# Patient Record
Sex: Female | Born: 1985 | Race: White | Hispanic: No | Smoking: Former smoker
Health system: Southern US, Community
[De-identification: ages and names within clinical notes are randomized; demographics above are authoritative.]

## PROBLEM LIST (undated history)

## (undated) DIAGNOSIS — F32A Depression, unspecified: Secondary | ICD-10-CM

## (undated) DIAGNOSIS — F419 Anxiety disorder, unspecified: Secondary | ICD-10-CM

## (undated) DIAGNOSIS — K219 Gastro-esophageal reflux disease without esophagitis: Secondary | ICD-10-CM

## (undated) DIAGNOSIS — Z789 Other specified health status: Secondary | ICD-10-CM

## (undated) HISTORY — DX: Anxiety disorder, unspecified: F41.9

## (undated) HISTORY — PX: NO PAST SURGERIES: SHX2092

## (undated) HISTORY — DX: Depression, unspecified: F32.A

## (undated) HISTORY — DX: Gastro-esophageal reflux disease without esophagitis: K21.9

## (undated) HISTORY — DX: Other specified health status: Z78.9

---
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
## 2007-07-22 ENCOUNTER — Emergency Department: Payer: Self-pay | Admitting: Emergency Medicine

## 2009-07-28 ENCOUNTER — Emergency Department: Payer: Self-pay | Admitting: Emergency Medicine

## 2009-07-30 ENCOUNTER — Observation Stay (HOSPITAL_COMMUNITY): Admission: EM | Admit: 2009-07-30 | Discharge: 2009-07-30 | Payer: Self-pay | Admitting: Emergency Medicine

## 2010-07-10 IMAGING — CT CT STONE STUDY
1 of 2 series · 15 of 32 positions shown, 19 images · non-contrast
Comparison: none

REASON FOR EXAM: Right flank pain with hematuria
COMMENTS:   LMP: Two weeks ago

[Series 2: stone · axial · 0.62mm/px · z∈[-894,-495]mm · 15 of 145 slices shown, 19 images]
[im 6/145  soft-tissue]
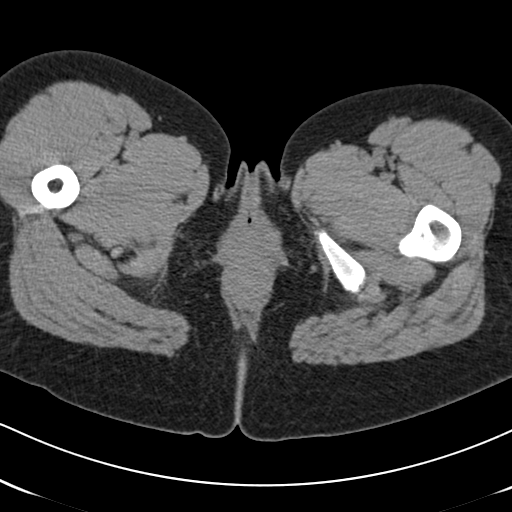
[im 6/145  bone]
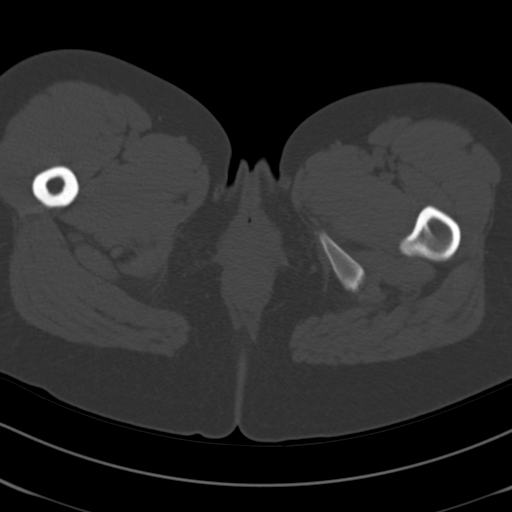
[im 17/145  soft-tissue]
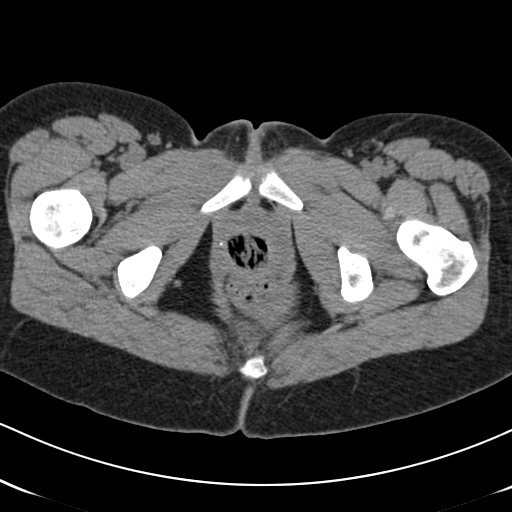
[im 28/145  soft-tissue]
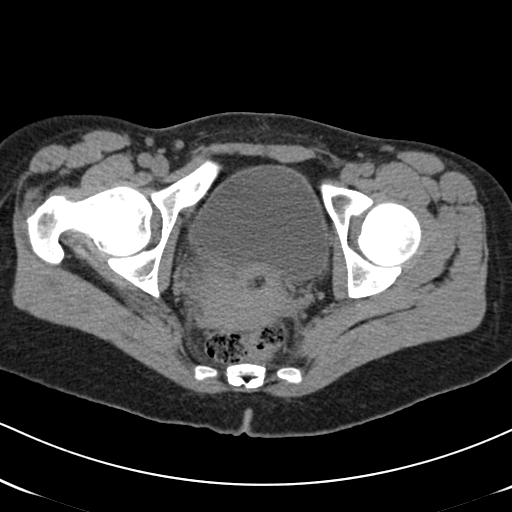
[im 39/145  soft-tissue]
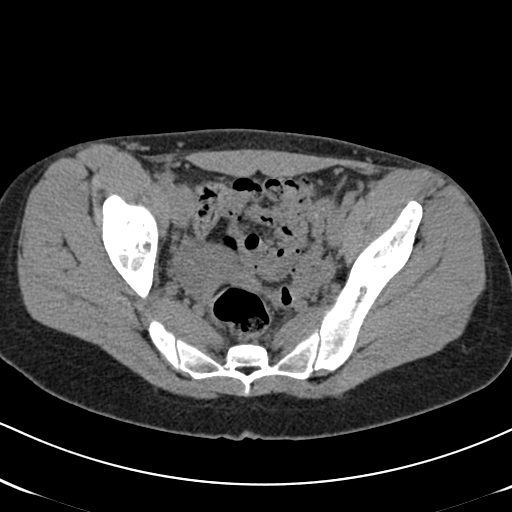
[im 50/145  soft-tissue]
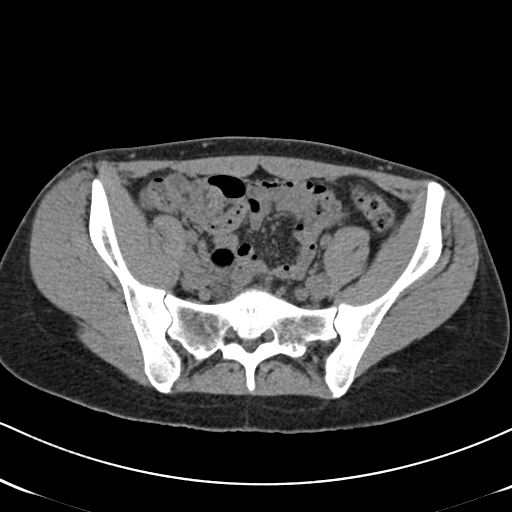
[im 61/145  soft-tissue]
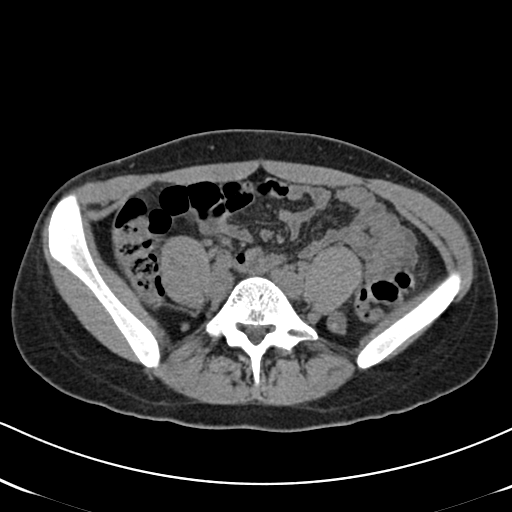
[im 73/145  soft-tissue]
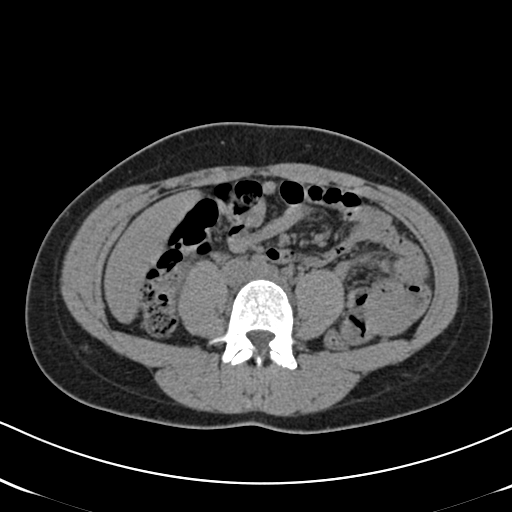
[im 84/145  soft-tissue]
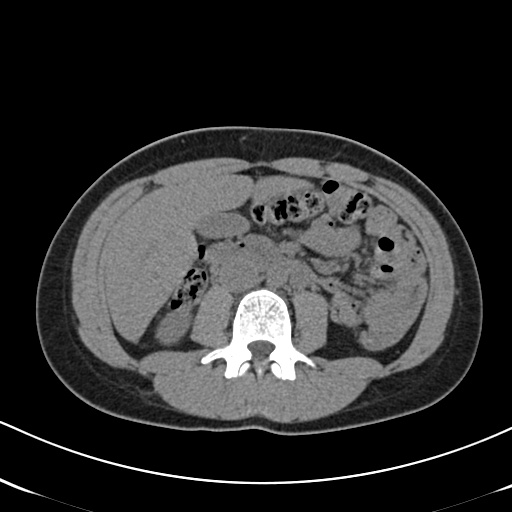
[im 95/145  soft-tissue]
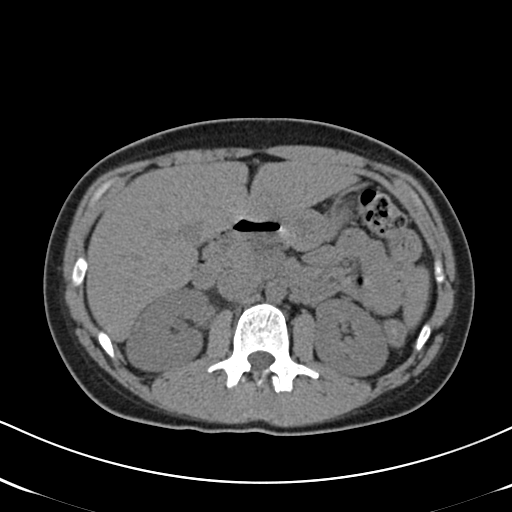
[im 95/145  bone]
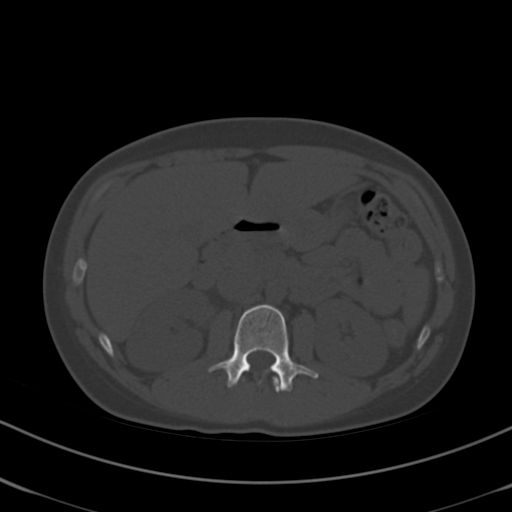
[im 106/145  soft-tissue]
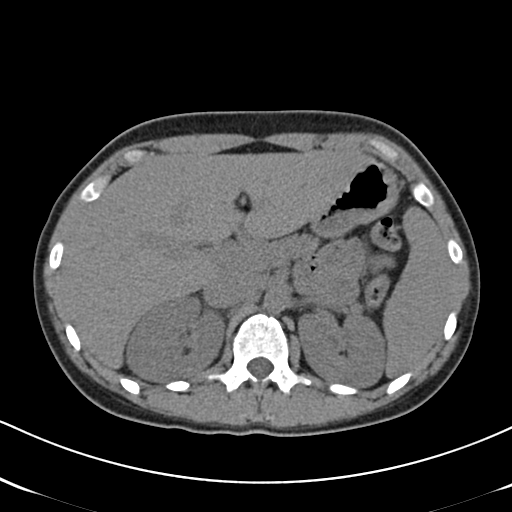
[im 117/145  soft-tissue]
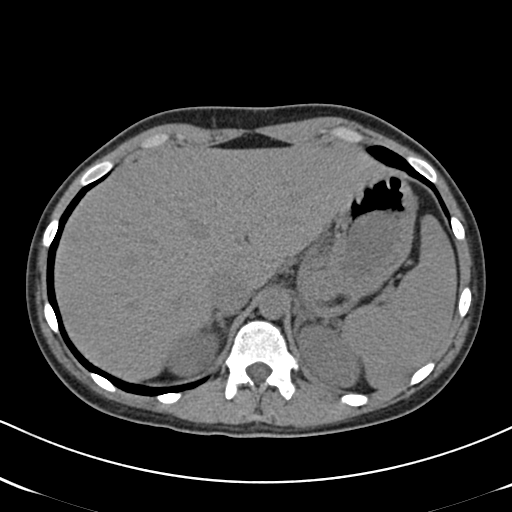
[im 122/145  lung]
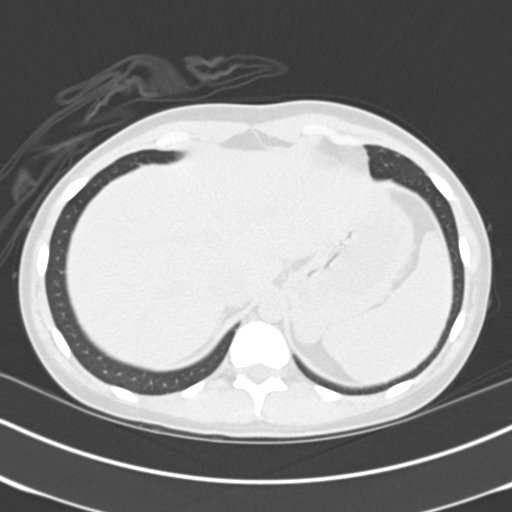
[im 128/145  soft-tissue]
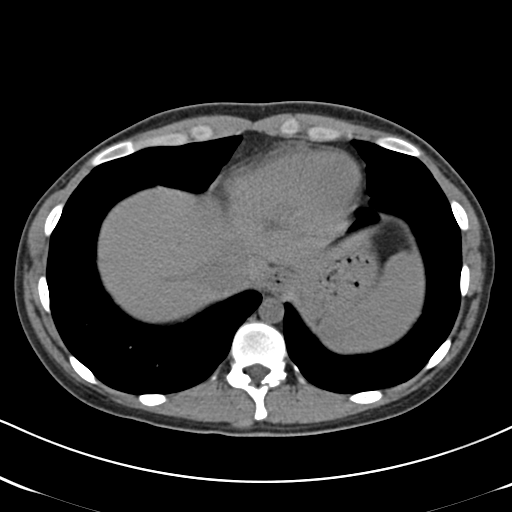
[im 128/145  lung]
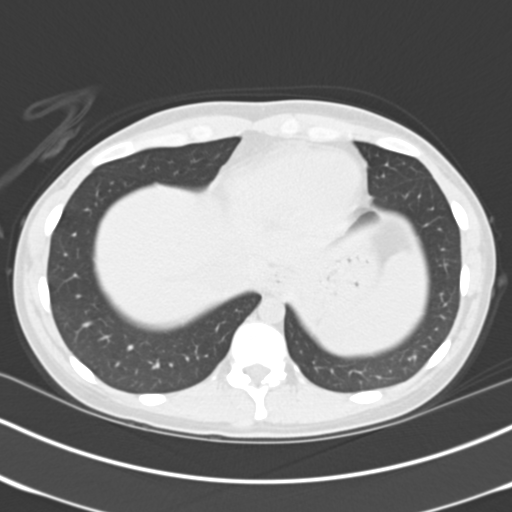
[im 133/145  lung]
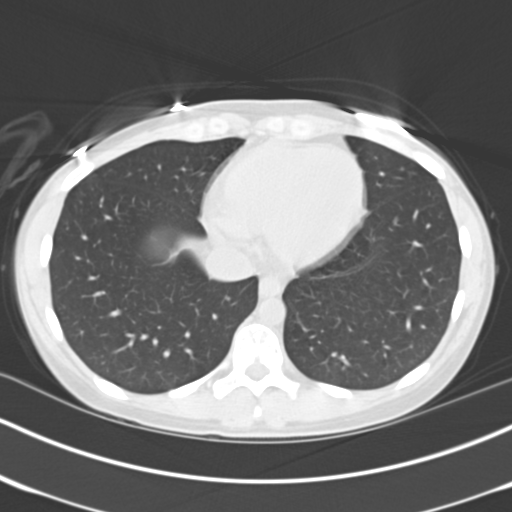
[im 139/145  soft-tissue]
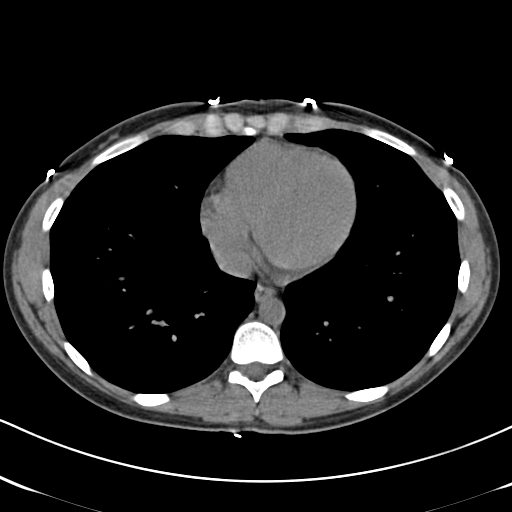
[im 139/145  lung]
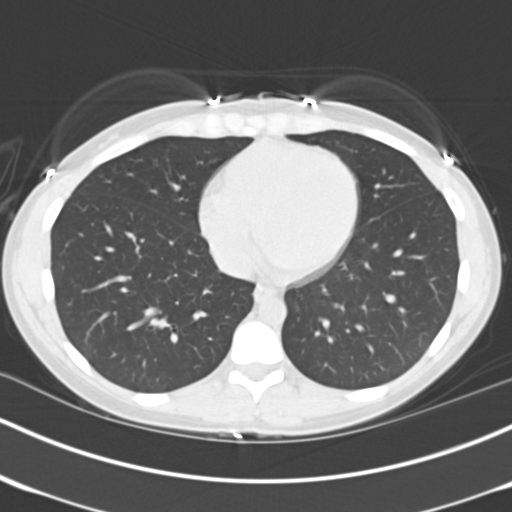

[15 of 32 positions shown; findings below may reference images not displayed]

PROCEDURE:     CT  - CT ABDOMEN /PELVIS WO (STONE)  - July 28, 2009 [DATE]

RESULT:     Axial noncontrast CT scanning was performed through the abdomen
and pelvis at 3 mm intervals and slice thicknesses. Review of 3-dimensional
reconstructed images was performed separately on the right server monitor.

The kidneys are normal in contour. I do not see evidence of calcified kidney
stones. Hydronephrosis and hydroureter are not demonstrated. There is a
calcification in the right adnexal region that may reflect a phlebolith. It
does not appear to be within the ureter. The partially distended urinary
bladder is normal in appearance.

The liver, spleen, partially distended gallbladder, and nondistended
stomach, pancreas, adrenal glands, and periaortic and pericaval regions
exhibit no acute abnormality. The unopacified loops of small and large bowel
are normal in appearance. I see no free fluid in the abdomen or pelvis.

The uterus is situated to the right of midline. I do not see evidence of
adnexal masses. The lumbar vertebral bodies are preserved in height. The
lung bases are clear.
IMPRESSION: 1. I do not see objective evidence of hydronephrosis nor hydroureter. No
definite calcified stones are demonstrated. If the patient's symptoms
strongly suggest urinary tract pathology, a followup contrast enhanced CT
scan with immediate and delayed images would be useful.
2. I do not see objective evidence of acute abnormality of the uterus or
ovaries.
3. I see no acute hepatobiliary abnormality nor acute bowel abnormality on
this noncontrast study. Again, followup contrast-enhanced scanning may be
useful if the patient's symptoms remain unexplained.

## 2010-07-12 IMAGING — US US RENAL
1 series · 14 of 25 positions shown · non-contrast
Comparison: None.

CLINICAL DATA: History of fever, nausea, vomiting.  Recent urinary
tract infection.

RENAL/URINARY TRACT ULTRASOUND COMPLETE

[Series 1: us renal · 0.28mm/px · 14 of 37 slices shown]
[im 1/37]
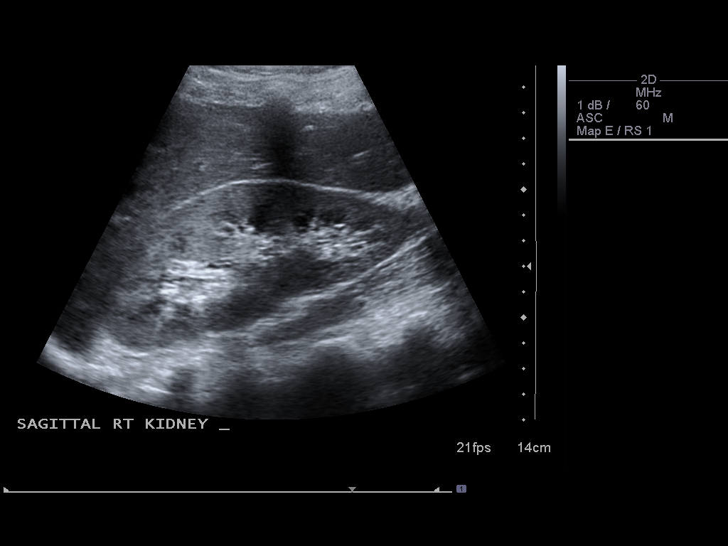
[im 4/37]
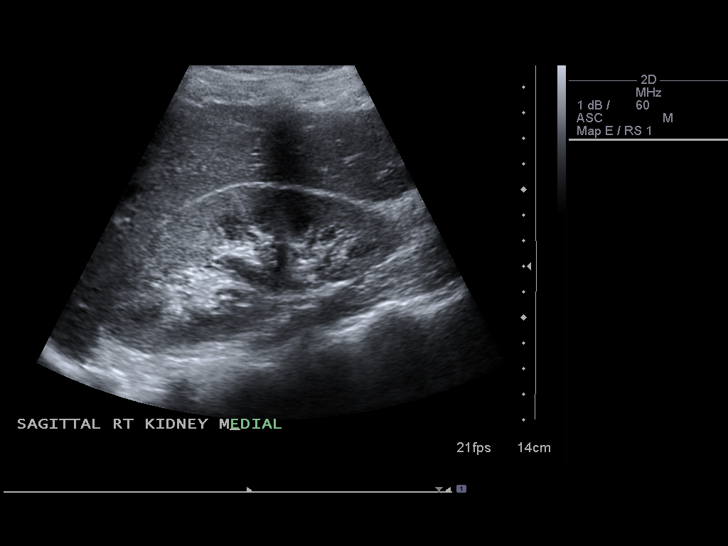
[im 7/37]
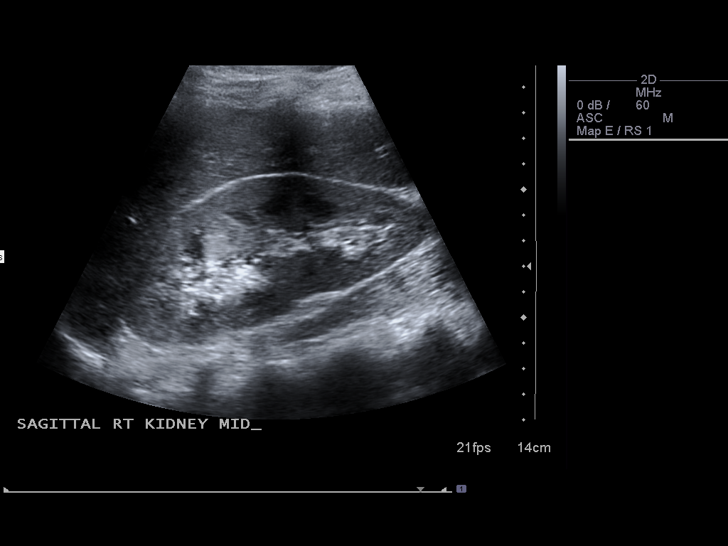
[im 10/37]
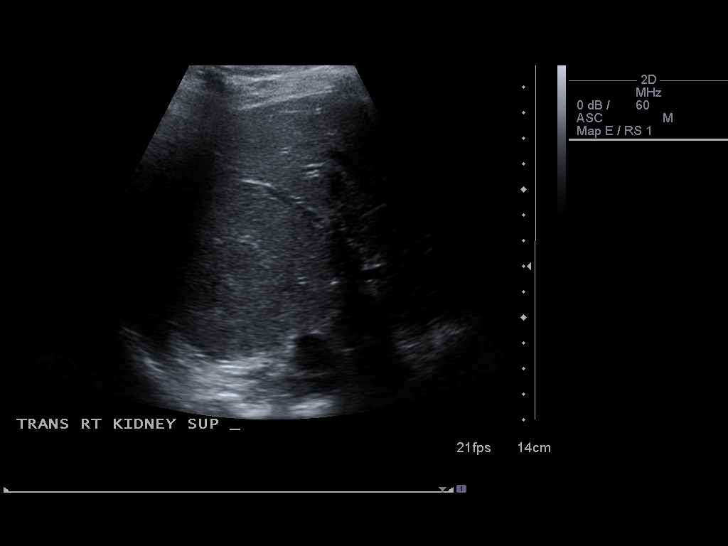
[im 13/37]
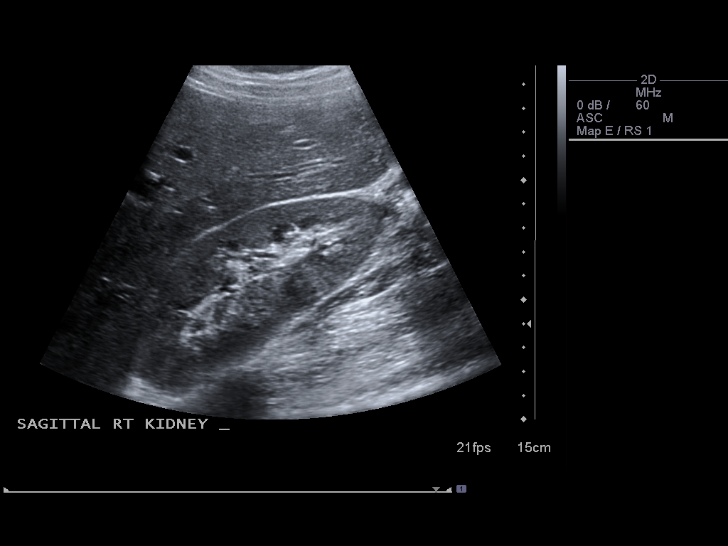
[im 14/37]
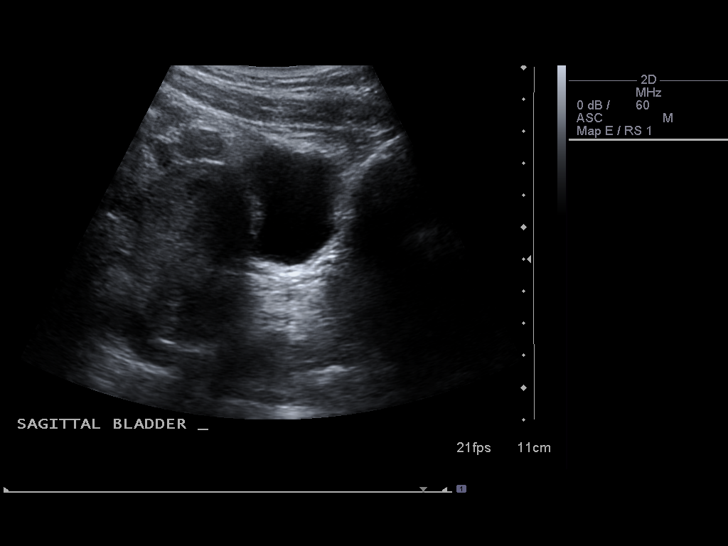
[im 17/37]
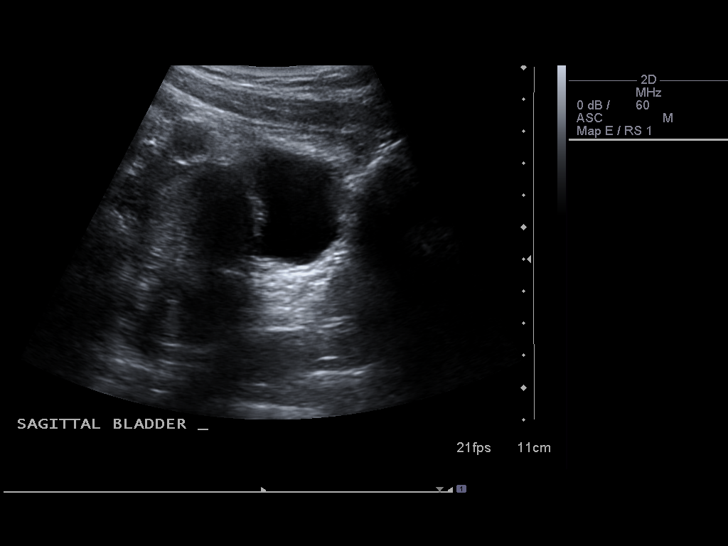
[im 20/37]
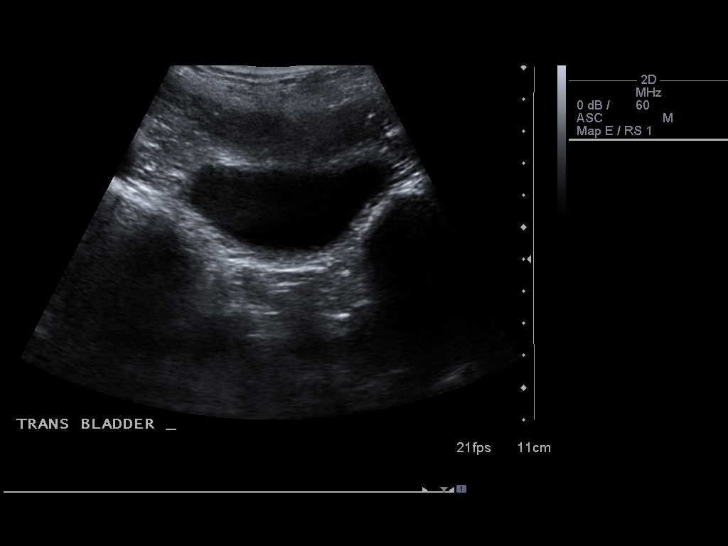
[im 23/37]
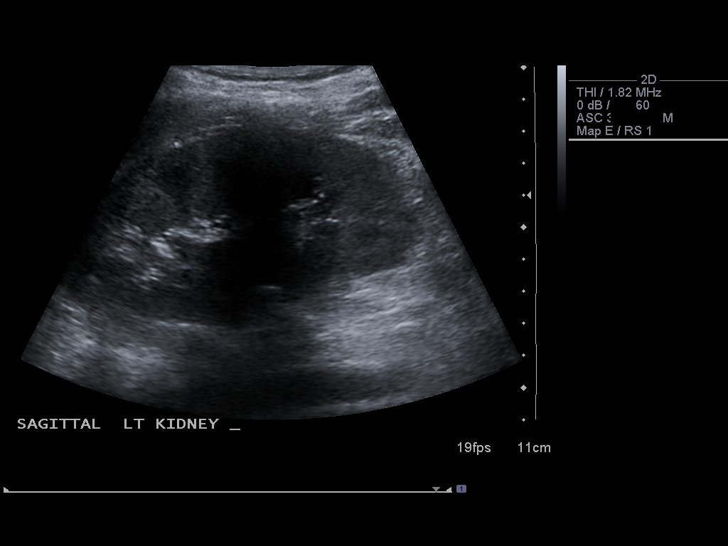
[im 25/37]
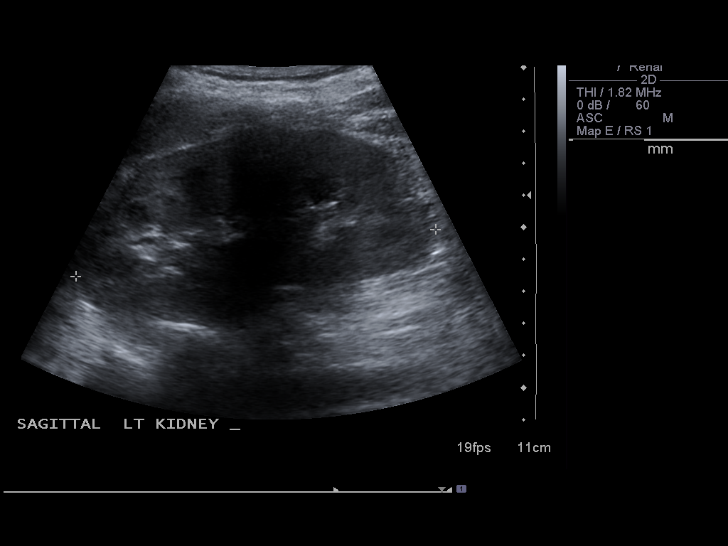
[im 28/37]
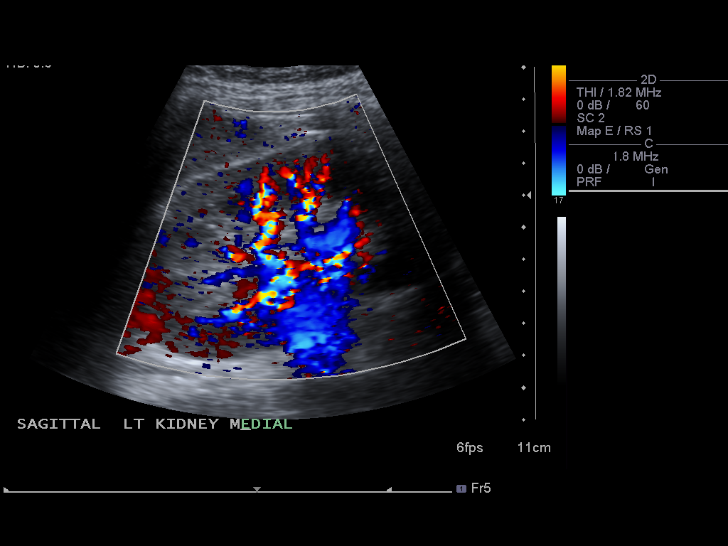
[im 31/37]
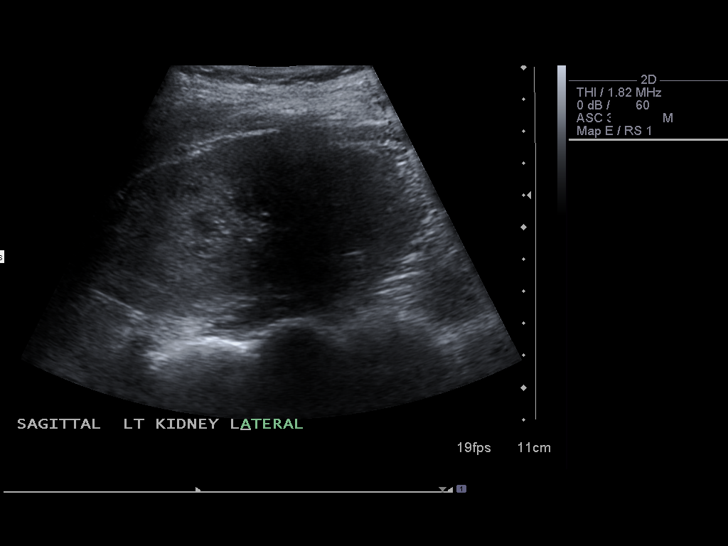
[im 34/37]
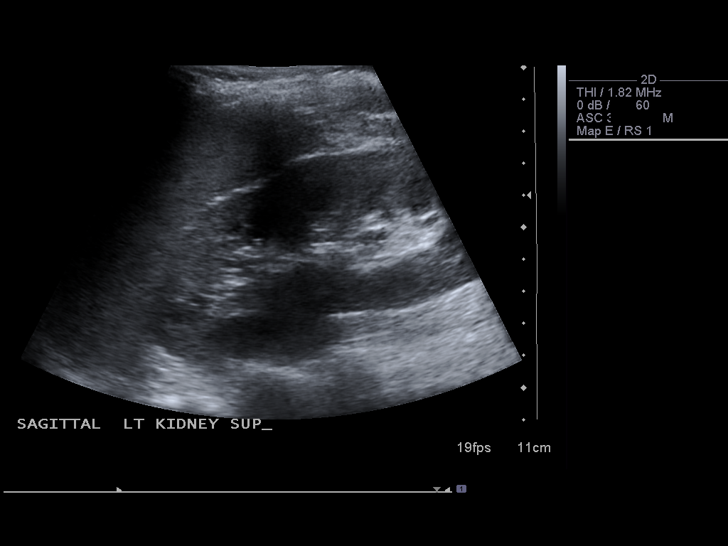
[im 37/37]
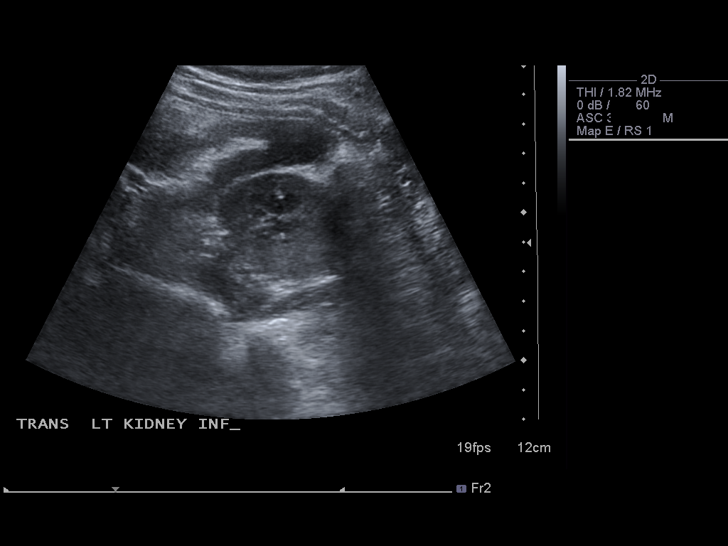

[14 of 25 positions shown; findings below may reference images not displayed]

FINDINGS: Right Kidney:  Right renal length is 13.1 cm.

Left Kidney:  Left renal length is 12.3 cm.

There are parenchyma of both kidneys is slightly hypoechoic and
with color Doppler ultrasound there appears to be hyperemia of the
parenchyma both kidneys.  This could be associated with infection.
No abscess is seen.  No hydronephrosis, solid mass, cyst, calculus,
or parenchymal loss is evident.

Bladder:  Urinary bladder was not completely distended. No bladder
lesion is evident.
IMPRESSION: Bilaterally and diffusely the renal parenchyma is slightly
hypoechoic.  With color Doppler examination the parenchyma of both
kidneys appear to be hyperemic.  This could reflect inflammation or
infection.  However no abscess or carbuncle is seen.  No
hydronephrosis is evident.  No focal parenchymal loss is evident.

## 2010-09-29 LAB — URINALYSIS, ROUTINE W REFLEX MICROSCOPIC
Ketones, ur: 15 mg/dL — AB
Nitrite: NEGATIVE
Protein, ur: >300 mg/dL — AB
Specific Gravity, Urine: 1.027 (ref 1.005–1.030)
Urobilinogen, UA: 1 mg/dL (ref 0.0–1.0)

## 2010-09-29 LAB — DIFFERENTIAL
Eosinophils Absolute: 0 10*3/uL (ref 0.0–0.7)
Eosinophils Relative: 0 % (ref 0–5)
Lymphocytes Relative: 5 % — ABNORMAL LOW (ref 12–46)
Lymphs Abs: 1 10*3/uL (ref 0.7–4.0)
Monocytes Relative: 12 % (ref 3–12)
Neutrophils Relative %: 82 % — ABNORMAL HIGH (ref 43–77)

## 2010-09-29 LAB — CULTURE, BLOOD (ROUTINE X 2): Culture: NO GROWTH

## 2010-09-29 LAB — URINE CULTURE
Colony Count: NO GROWTH
Culture: NO GROWTH

## 2010-09-29 LAB — COMPREHENSIVE METABOLIC PANEL
ALT: 16 U/L (ref 0–35)
AST: 33 U/L (ref 0–37)
Calcium: 8.5 mg/dL (ref 8.4–10.5)
Creatinine, Ser: 1.01 mg/dL (ref 0.4–1.2)
GFR calc Af Amer: >60 mL/min (ref >60)
Sodium: 132 mEq/L — ABNORMAL LOW (ref 135–145)
Total Protein: 6.9 g/dL (ref 6.0–8.3)

## 2010-09-29 LAB — URINE MICROSCOPIC-ADD ON

## 2010-09-29 LAB — CBC
MCHC: 33.5 g/dL (ref 30.0–36.0)
RDW: 12.6 % (ref 11.5–15.5)

## 2010-09-29 LAB — POCT PREGNANCY, URINE: Preg Test, Ur: NEGATIVE

## 2016-12-25 ENCOUNTER — Ambulatory Visit
Admission: EM | Admit: 2016-12-25 | Discharge: 2016-12-25 | Disposition: A | Discharge status: Home / Self Care | Payer: BC Managed Care – PPO | Attending: Emergency Medicine | Admitting: Emergency Medicine

## 2016-12-25 ENCOUNTER — Encounter: Payer: Self-pay | Admitting: *Deleted

## 2016-12-25 DIAGNOSIS — J029 Acute pharyngitis, unspecified: Secondary | ICD-10-CM

## 2016-12-25 LAB — RAPID STREP SCREEN (MED CTR MEBANE ONLY): STREPTOCOCCUS, GROUP A SCREEN (DIRECT): NEGATIVE

## 2016-12-25 MED ORDER — FLUTICASONE PROPIONATE 50 MCG/ACT NA SUSP: 2.0000 | Freq: Every day | NASAL | 0 refills | Status: DC

## 2016-12-25 MED ORDER — DEXAMETHASONE SODIUM PHOSPHATE 10 MG/ML IJ SOLN
10.0000 mg | Freq: Once | INTRAMUSCULAR | Status: AC
  Administered 2016-12-25: 10 mg via INTRAMUSCULAR

## 2016-12-25 MED ORDER — IBUPROFEN 600 MG PO TABS: 600.0000 mg | ORAL_TABLET | Freq: Four times a day (QID) | ORAL | 0 refills | Status: DC | PRN

## 2016-12-25 NOTE — Discharge Instructions (Signed)
Here is a list of primary care providers who are taking new patients:  Dr. Elizabeth Sauereanna Birch, Dr. Schuyler AmorWilliam Plonk [address] [place] [id number] [phone]  Dr. Everlene OtherJayce Cook [address]  [place] [id number]  [phone]  [facility] [place] [address] [place] [address]  [place] [address]  [phone]  [facility] [address]  [place], [place] [address] [phone]  [facility] [place] [address]  [phone] [place], [place] [address]  Go to www.goodrx.com to look up your medications. This will give you a list of where you can find your prescriptions at the most affordable prices. Or ask the pharmacist what the cash price is. This can be less expensive than what you would pay with insurance.    your rapid strep was negative today, so we have sent off a throat culture.  We will contact you and call in the appropriate antibiotics if your culture comes back positive for an infection requiring antibiotic treatment.  Give us a working phone number.  1 gram of Tylenol and 600 mg ibuprofen together 3-4 times a day as needed for pain.  Make sure you drink plenty of extra fluids.  Some people find salt water gargles and  Traditional Medicinal's "Throat Coat" tea helpful. Take 5 mL of liquid Benadryl and 5 mL of Maalox. Mix it together, and then hold it in your mouth for as long as you can and then swallow. You may do this 4 times a day.  Turn here or follow with primary care physician of your choice for mono testing if not better in 4 or 5 days.

## 2016-12-25 NOTE — ED Triage Notes (Signed)
Patient started having symptoms of sore throat, body aches, and fever 3 days ago.

## 2016-12-25 NOTE — ED Provider Notes (Signed)
HPI  SUBJECTIVE:  Patient reports sore throat starting 3 days ago. Sx worse with swallowing, talking.  Sx better with ibuprofen. Has been taking DayQuil, NyQuil w/ o relief.  + Fever tmax 101 + Swollen neck glands    No Cough/URI sxs. + Myalgias + Headache No Rash     No Recent Strep or mono Exposure the patient is a Associate Professor and also works at [employer] No Abdominal Pain No reflux sxs No Allergy sxs  No Breathing difficulty, muffled hot potato voice. She does report a raspy voice No Drooling No Trismus No abx in past month.  No ear pain, sinus pain or pressure + antipyretic in past 6-8 hours hrs Patient is not a smoker. She has a past medical history of GERD but states it is not bothering her. No history of mono, recurrent stroke, diabetes, hypertension LMP: 2 weeks ago. Denies possibility of being pregnant PMD: None   History reviewed. No pertinent past medical history.  History reviewed. No pertinent surgical history.  History reviewed. No pertinent family history.  Social History  Substance Use Topics  . Smoking status: Former Games developer  . Smokeless tobacco: Never Used  . Alcohol use Yes    No current facility-administered medications for this encounter.   Current Outpatient Prescriptions:  .  fluticasone (FLONASE) 50 MCG/ACT nasal spray, Place 2 sprays into both nostrils daily., Disp: 16 g, Rfl: 0 .  ibuprofen (ADVIL,MOTRIN) 600 MG tablet, Take 1 tablet (600 mg total) by mouth every 6 (six) hours as needed., Disp: 30 tablet, Rfl: 0  Allergies  Allergen Reactions  . Sunflower Oil Anxiety     ROS  As noted in HPI.   Physical Exam  BP (!) 122/91 (BP Location: Left Arm)   Pulse 98   Temp 98.3 F (36.8 C) (Oral)   Resp 16   Ht 5\' 2"  (1.575 m)   Wt 150 lb (68 kg)   LMP 12/11/2016   SpO2 100%   BMI 27.44 kg/m   Constitutional: Well developed, well nourished, no acute distress Eyes:  EOMI, conjunctiva normal bilaterally HENT: Normocephalic,  atraumatic,mucus membranes moist. +  nasal congestion . No sinus tenderness + erythematous oropharynx - enlarged tonsils +  exudates. Uvula midline. + PND  Respiratory: Normal inspiratory effort Cardiovascular: Normal rate, no murmurs, rubs, gallops GI: nondistended, nontender. No appreciable splenomegaly skin: No rash, skin intact Lymph: + tender cervical LN L>R Musculoskeletal: no deformities Neurologic: Alert & oriented x 3, no focal neuro deficits Psychiatric: Speech and behavior appropriate.  ED Course   Medications  dexamethasone (DECADRON) injection 10 mg (10 mg Intramuscular Given 12/25/16 1119)    Orders Placed This Encounter  Procedures  . Rapid strep screen    Standing Status:   Standing    Number of Occurrences:   1  . Culture, group A strep    Standing Status:   Standing    Number of Occurrences:   1    Results for orders placed or performed during the hospital encounter of 12/25/16 (from the past 24 hour(s))  Rapid strep screen     Status: None   Collection Time: 12/25/16 10:01 AM  Result Value Ref Range   Streptococcus, Group A Screen (Direct) NEGATIVE NEGATIVE   No results found.  ED Clinical Impression  Pharyngitis, unspecified etiology   ED Assessment/Plan   Rapid strep negative, however pt with centor score 4/4. Discussed option of treating empirically for strep today, however, patient states that she frequently gets yeast  infections and would like to wait for the results of the throat culture prior to initiating antibiotic treatment. Also offered to test for mono today, but I feel that it is too early in the course of illness and that a negative mono test would not necessarily rule out mono. She will return here in 3-4 days for mono testing if she still has symptoms.. Obtaining throat culture to guide antibiotic treatment. Discussed this with patient We'll contact them if culture is positive, and will call in Appropriate antibiotics. Patient home with  ibuprofen, Tylenol, Benadryl/Maalox mixture. Giving 10 mg of Decadron IM here for symptomatic relief. Also Flonase. We'll provide a primary care referral  Discussed labs, imaging, MDM, plan and followup with patient . Discussed sn/sx that should prompt return to the  ED. Patient agrees with plan.  Meds ordered this encounter  Medications  . dexamethasone (DECADRON) injection 10 mg  . ibuprofen (ADVIL,MOTRIN) 600 MG tablet    Sig: Take 1 tablet (600 mg total) by mouth every 6 (six) hours as needed.    Dispense:  30 tablet    Refill:  0  . fluticasone (FLONASE) 50 MCG/ACT nasal spray    Sig: Place 2 sprays into both nostrils daily.    Dispense:  16 g    Refill:  0     *This clinic note was created using Scientist, clinical (histocompatibility and immunogenetics)Dragon dictation software. Therefore, there may be occasional mistakes despite careful proofreading.    Domenick GongMortenson, Trentin Knappenberger, MD 12/25/16 1149

## 2016-12-27 LAB — CULTURE, GROUP A STREP (THRC)

## 2018-12-28 ENCOUNTER — Encounter: Payer: Self-pay | Admitting: Obstetrics and Gynecology

## 2018-12-28 ENCOUNTER — Ambulatory Visit: Payer: BC Managed Care – PPO | Admitting: Obstetrics and Gynecology

## 2018-12-28 ENCOUNTER — Other Ambulatory Visit (HOSPITAL_COMMUNITY)
Admission: RE | Admit: 2018-12-28 | Discharge: 2018-12-28 | Disposition: A | Discharge status: Home / Self Care | Payer: BC Managed Care – PPO | Source: Ambulatory Visit | Attending: Obstetrics and Gynecology | Admitting: Obstetrics and Gynecology

## 2018-12-28 ENCOUNTER — Other Ambulatory Visit: Payer: Self-pay

## 2018-12-28 VITALS — BP 111/76 | HR 106 | Ht 62.0 in | Wt 144.0 lb

## 2018-12-28 DIAGNOSIS — Z30011 Encounter for initial prescription of contraceptive pills: Secondary | ICD-10-CM

## 2018-12-28 DIAGNOSIS — Z01419 Encounter for gynecological examination (general) (routine) without abnormal findings: Secondary | ICD-10-CM

## 2018-12-28 DIAGNOSIS — Z113 Encounter for screening for infections with a predominantly sexual mode of transmission: Secondary | ICD-10-CM | POA: Diagnosis present

## 2018-12-28 DIAGNOSIS — Z124 Encounter for screening for malignant neoplasm of cervix: Secondary | ICD-10-CM | POA: Diagnosis present

## 2018-12-28 DIAGNOSIS — F32A Depression, unspecified: Secondary | ICD-10-CM | POA: Insufficient documentation

## 2018-12-28 DIAGNOSIS — F321 Major depressive disorder, single episode, moderate: Secondary | ICD-10-CM

## 2018-12-28 DIAGNOSIS — Z1339 Encounter for screening examination for other mental health and behavioral disorders: Secondary | ICD-10-CM

## 2018-12-28 DIAGNOSIS — F329 Major depressive disorder, single episode, unspecified: Secondary | ICD-10-CM | POA: Insufficient documentation

## 2018-12-28 DIAGNOSIS — Z1331 Encounter for screening for depression: Secondary | ICD-10-CM

## 2018-12-28 MED ORDER — CITALOPRAM HYDROBROMIDE 10 MG PO TABS: 10.0000 mg | ORAL_TABLET | Freq: Every day | ORAL | 0 refills | Status: DC

## 2018-12-28 MED ORDER — LO LOESTRIN FE 1 MG-10 MCG / 10 MCG PO TABS: 1.0000 | ORAL_TABLET | Freq: Every day | ORAL | 4 refills | Status: DC
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
## 2018-12-29 LAB — CYTOLOGY - PAP

## 2018-12-30 LAB — CYTOLOGY - PAP
Chlamydia: NEGATIVE
Diagnosis: NEGATIVE
HPV: NOT DETECTED
Neisseria Gonorrhea: NEGATIVE
Trichomonas: NEGATIVE

## 2018-12-30 LAB — RPR+HSVIGM+HBSAG+HSV2(IGG)+...
HIV Screen 4th Generation wRfx: NONREACTIVE
HSV 2 IgG, Type Spec: <0.91 index (ref 0.00–0.90)
HSVI/II Comb IgM: 1.57 Ratio — ABNORMAL HIGH (ref 0.00–0.90)
Hepatitis B Surface Ag: NEGATIVE
RPR Ser Ql: NONREACTIVE

## 2019-01-03 ENCOUNTER — Other Ambulatory Visit: Payer: Self-pay | Admitting: Advanced Practice Midwife

## 2019-01-03 DIAGNOSIS — Z30011 Encounter for initial prescription of contraceptive pills: Secondary | ICD-10-CM

## 2019-01-03 MED ORDER — NORETHIN ACE-ETH ESTRAD-FE 1-20 MG-MCG PO TABS: 1.0000 | ORAL_TABLET | Freq: Every day | ORAL | 4 refills | Status: DC

## 2019-01-03 NOTE — Progress Notes (Signed)
Sent Rx for Junel due to patient insurance doesn't cover Lo Loestrin Fe.

## 2019-01-06 ENCOUNTER — Other Ambulatory Visit: Payer: Self-pay | Admitting: Obstetrics and Gynecology

## 2019-01-06 DIAGNOSIS — Z113 Encounter for screening for infections with a predominantly sexual mode of transmission: Secondary | ICD-10-CM

## 2019-01-25 ENCOUNTER — Ambulatory Visit: Payer: BC Managed Care – PPO | Admitting: Obstetrics and Gynecology

## 2019-01-25 ENCOUNTER — Other Ambulatory Visit: Payer: Self-pay

## 2019-01-25 ENCOUNTER — Other Ambulatory Visit: Payer: Self-pay | Admitting: Obstetrics and Gynecology

## 2019-01-25 ENCOUNTER — Encounter: Payer: Self-pay | Admitting: Obstetrics and Gynecology

## 2019-01-25 VITALS — BP 117/79 | HR 80 | Wt 155.0 lb

## 2019-01-25 DIAGNOSIS — Z113 Encounter for screening for infections with a predominantly sexual mode of transmission: Secondary | ICD-10-CM

## 2019-01-25 DIAGNOSIS — F321 Major depressive disorder, single episode, moderate: Secondary | ICD-10-CM

## 2019-01-25 DIAGNOSIS — F419 Anxiety disorder, unspecified: Secondary | ICD-10-CM

## 2019-01-25 NOTE — Telephone Encounter (Signed)
Please advise 

## 2019-01-25 NOTE — Progress Notes (Signed)
Subjective:     Yolanda Evans is a 33 y.o. female who presents for follow up of depression. Current symptoms include anhedonia, depressed mood, difficulty concentrating, fatigue and insomnia. Symptoms have been gradually improving since that time. Patient denies feelings of worthlessness/guilt, psychomotor agitation, psychomotor retardation, suicidal thoughts with specific plan, suicidal thoughts without plan and poor appetite or overeating. Previous treatment includes: medication (Celexa 10 mg). She complains of the following side effects from the treatment: feeling tired.  She tried the medication for about 3 weeks, but it just made her feel very tired. She was taking it in the morning for two weeks, then at night, but she still felt tired during the day.   Since she stopped taking the medication her irritability has come back. She would rate her irritability when it was at its best, it would have been rated as a 1 (now a 3 again).    Anxiety: Patient complains of anxiety disorder.  She has the following symptoms: irritable, feeling nervous, anxious, or on edge and trouble relaxing. Onset of symptoms was approximately a few weeks ago, unchanged since that time. She denies current suicidal and homicidal ideation. Family history significant for no psychiatric illness.Possible organic causes contributing are: work environment. Risk factors: negative life event recent break up Previous treatment includes Celexa.  She complains of the following side effects from the treatment: feeling tired during the day  The following portions of the patient's history were reviewed and updated as appropriate: allergies, current medications, past family history, past medical history, past social history, past surgical history and problem list.  Review of Systems Pertinent items are noted in HPI.    Objective:    BP 117/79   Pulse 80   Wt 155 lb (70.3 kg)   LMP 01/24/2019   BMI 28.35 kg/m   General:  alert,  cooperative and no distress  Affect & Behavior:  full facial expressions, good grooming, good insight, normal perception, normal reasoning, normal speech pattern and content and normal thought patterns      Depression screen [facility] 2/[facility] 01/25/2019 12/28/2018  Decreased Interest 3 2  Down, Depressed, Hopeless 1 2  PHQ - 2 Score 4 4  Altered sleeping 3 3  Tired, decreased energy 3 3  Change in appetite 0 0  Feeling bad or failure about yourself  0 0  Trouble concentrating 3 3  Moving slowly or fidgety/restless 0 0  Suicidal thoughts 0 0  PHQ-9 Score 13 13  Difficult doing work/chores Somewhat difficult Somewhat difficult   GAD 7 : Generalized Anxiety Score 01/25/2019  Nervous, Anxious, on Edge 3  Control/stop worrying 0  Worry too much - different things 0  Trouble relaxing 3  Restless 1  Easily annoyed or irritable 3  Afraid - awful might happen 0  Total GAD 7 Score 10  Anxiety Difficulty Somewhat difficult    Assessment:    Depression, unchanged  Anxiety - new    Plan:    Medications: Wellbutrin. Continue with counseling. Instructed patient to contact office or on-call physician promptly should condition worsen or any new symptoms appear and provided on-call telephone numbers. IF THE PATIENT HAS ANY SUICIDAL OR HOMICIDAL IDEATIONS, CALL THE OFFICE, DISCUSS WITH A SUPPORT MEMBER, OR GO TO THE ER IMMEDIATELY. Patient was agreeable with this plan. Follow up: 4 weeks. Spent 20 minutes (>50% of visit) discussing the risks of anxiety disorder and depression, the  pathophysiology, etiology, risks, and principles of treatment.    Thomasene MohairStephen Modesta Sammons, MD, Evern CoreFACOG  [place] 01/25/2019 4:40 PM

## 2019-01-27 LAB — HSV(HERPES SMPLX)ABS-I+II(IGG+IGM)-BLD
HSV 1 Glycoprotein G Ab, IgG: 11 index — ABNORMAL HIGH (ref 0.00–0.90)
HSV 2 IgG, Type Spec: <0.91 index (ref 0.00–0.90)
HSVI/II Comb IgM: 1.12 Ratio — ABNORMAL HIGH (ref 0.00–0.90)

## 2019-01-28 MED ORDER — BUPROPION HCL ER (XL) 150 MG PO TB24: 150.0000 mg | ORAL_TABLET | Freq: Every day | ORAL | 1 refills | Status: DC

## 2019-01-28 NOTE — Addendum Note (Signed)
Addended by: Prentice Docker D on: 01/28/2019 10:32 AM   Modules accepted: Orders

## 2019-03-26 ENCOUNTER — Other Ambulatory Visit: Payer: Self-pay | Admitting: Obstetrics and Gynecology

## 2019-03-26 DIAGNOSIS — F419 Anxiety disorder, unspecified: Secondary | ICD-10-CM

## 2019-03-26 DIAGNOSIS — F321 Major depressive disorder, single episode, moderate: Secondary | ICD-10-CM

## 2019-03-27 NOTE — Telephone Encounter (Signed)
Advise

## 2019-03-28 NOTE — Telephone Encounter (Signed)
If she schedules a follow up appointment, i'll refill.

## 2019-03-30 ENCOUNTER — Encounter: Payer: Self-pay | Admitting: Obstetrics and Gynecology

## 2019-03-30 ENCOUNTER — Other Ambulatory Visit: Payer: Self-pay | Admitting: Obstetrics and Gynecology

## 2019-03-30 DIAGNOSIS — F321 Major depressive disorder, single episode, moderate: Secondary | ICD-10-CM

## 2019-03-30 DIAGNOSIS — F419 Anxiety disorder, unspecified: Secondary | ICD-10-CM

## 2019-03-30 MED ORDER — BUPROPION HCL ER (XL) 150 MG PO TB24: 150.0000 mg | ORAL_TABLET | Freq: Every day | ORAL | 1 refills | Status: DC

## 2019-03-30 NOTE — Telephone Encounter (Signed)
Patient is schedule 04/05/19 with SDJ

## 2019-04-05 ENCOUNTER — Other Ambulatory Visit: Payer: Self-pay

## 2019-04-05 ENCOUNTER — Encounter: Payer: Self-pay | Admitting: Obstetrics and Gynecology

## 2019-04-05 ENCOUNTER — Ambulatory Visit: Payer: BC Managed Care – PPO | Admitting: Obstetrics and Gynecology

## 2019-04-05 VITALS — BP 109/74 | HR 84 | Wt 153.0 lb

## 2019-04-05 DIAGNOSIS — F419 Anxiety disorder, unspecified: Secondary | ICD-10-CM | POA: Diagnosis not present

## 2019-04-05 DIAGNOSIS — F321 Major depressive disorder, single episode, moderate: Secondary | ICD-10-CM | POA: Diagnosis not present

## 2019-04-05 MED ORDER — BUPROPION HCL ER (XL) 150 MG PO TB24: 150.0000 mg | ORAL_TABLET | Freq: Every day | ORAL | 3 refills | Status: DC

## 2019-04-05 NOTE — Progress Notes (Signed)
Obstetrics & Gynecology Office Visit   Chief Complaint  Patient presents with  . Follow-up    Medication anxiety/depression    History of Present Illness: 33 y.o. G52P0040 female who presents in follow up for anxiety and depression. She was started on Wellbutrin 2 months ago.  She was on Celexa and stopped that.  Since her last visit her symptoms have improved.  She is still having trouble sleeping at night.  She does have increased energy.  She states that she is not sleeping due to not being very tired.  She was taking Wellbutrin in the morning, but found it easier to sleep if she took the medication at night.  She has no side effects to the medication.  She states that she has the energy to get through the day.  She denies SI/HI.  Depression screen [facility] 2/9 04/05/2019 01/25/2019 12/28/2018  Decreased Interest 1 3 2   Down, Depressed, Hopeless 0 1 2  PHQ - 2 Score 1 4 4   Altered sleeping - 3 3  Tired, decreased energy - 3 3  Change in appetite - 0 0  Feeling bad or failure about yourself  - 0 0  Trouble concentrating - 3 3  Moving slowly or fidgety/restless - 0 0  Suicidal thoughts - 0 0  PHQ-9 Score - 13 13  Difficult doing work/chores - Somewhat difficult Somewhat difficult   GAD 7 : Generalized Anxiety Score 04/05/2019 01/25/2019  Nervous, Anxious, on Edge 1 3  Control/stop worrying 0 0  Worry too much - different things 0 0  Trouble relaxing 0 3  Restless 0 1  Easily annoyed or irritable 1 3  Afraid - awful might happen 0 0  Total GAD 7 Score 2 10  Anxiety Difficulty Not difficult at all Somewhat difficult    Past Medical History:  Diagnosis Date  . No known health problems     Past Surgical History:  Procedure Laterality Date  . NO PAST SURGERIES      Gynecologic History: Patient's last menstrual period was 03/29/2019.  Obstetric History: G4P0040  Family History  Problem Relation Age of Onset  . Breast cancer Paternal Grandmother        no contact    Social  History   Socioeconomic History  . Marital status: Single    Spouse name: Not on file  . Number of children: Not on file  . Years of education: Not on file  . Highest education level: Not on file  Occupational History  . Not on file  Social Needs  . Financial resource strain: Not on file  . Food insecurity    Worry: Not on file    Inability: Not on file  . Transportation needs    Medical: Not on file    Non-medical: Not on file  Tobacco Use  . Smoking status: Current Every Day Smoker    Packs/day: 0.25    Types: Cigarettes  . Smokeless tobacco: Never Used  Substance and Sexual Activity  . Alcohol use: Yes  . Drug use: No  . Sexual activity: Yes    Birth control/protection: Pill  Lifestyle  . Physical activity    Days per week: Not on file    Minutes per session: Not on file  . Stress: Not on file  Relationships  . Social 01/27/2019 on phone: Not on file    Gets together: Not on file    Attends religious service: Not on file  Active member of club or organization: Not on file    Attends meetings of clubs or organizations: Not on file    Relationship status: Not on file  . Intimate partner violence    Fear of current or ex partner: Not on file    Emotionally abused: Not on file    Physically abused: Not on file    Forced sexual activity: Not on file  Other Topics Concern  . Not on file  Social History Narrative  . Not on file    Allergies  Allergen Reactions  . Sunflower Oil Anxiety    Prior to Admission medications   Medication Sig Start Date End Date Taking? Authorizing Provider  buPROPion (WELLBUTRIN XL) 150 MG 24 hr tablet Take 1 tablet (150 mg total) by mouth daily. 03/30/19   Will Bonnet, MD  norethindrone-ethinyl estradiol (LOESTRIN FE) 1-20 MG-MCG tablet Take 1 tablet by mouth daily. 01/03/19 01/03/20  Rod Can, CNM    Review of Systems  Constitutional: Negative.   HENT: Negative.   Eyes: Negative.   Respiratory: Negative.    Cardiovascular: Negative.   Gastrointestinal: Negative.   Genitourinary: Negative.   Musculoskeletal: Negative.   Skin: Negative.   Neurological: Negative.   Psychiatric/Behavioral: Negative for depression, hallucinations, memory loss, substance abuse and suicidal ideas. The patient has insomnia. The patient is not nervous/anxious.      Physical Exam BP 109/74   Pulse 84   Wt 153 lb (69.4 kg)   LMP 03/29/2019   BMI 27.98 kg/m  Patient's last menstrual period was 03/29/2019. Physical Exam Constitutional:      General: She is not in acute distress.    Appearance: Normal appearance. She is well-developed.  HENT:     Head: Normocephalic and atraumatic.  Eyes:     General: No scleral icterus.    Conjunctiva/sclera: Conjunctivae normal.  Neck:     Musculoskeletal: Normal range of motion and neck supple.  Cardiovascular:     Rate and Rhythm: Normal rate and regular rhythm.     Heart sounds: No murmur. No friction rub. No gallop.   Pulmonary:     Effort: Pulmonary effort is normal. No respiratory distress.     Breath sounds: Normal breath sounds. No wheezing or rales.  Abdominal:     General: Bowel sounds are normal. There is no distension.     Palpations: Abdomen is soft. There is no mass.     Tenderness: There is no abdominal tenderness. There is no guarding or rebound.  Musculoskeletal: Normal range of motion.  Neurological:     General: No focal deficit present.     Mental Status: She is alert and oriented to person, place, and time.     Cranial Nerves: No cranial nerve deficit.  Skin:    General: Skin is warm and dry.     Findings: No erythema.  Psychiatric:        Mood and Affect: Mood normal.        Behavior: Behavior normal.        Judgment: Judgment normal.    Female chaperone present for pelvic and breast  portions of the physical exam  Assessment: 33 y.o. G86P0040 female here for  1. Current moderate episode of major depressive disorder, unspecified whether  recurrent ([place])   2. Anxiety      Plan: Problem List Items Addressed This Visit      Other   Depression - Primary   Relevant Medications   buPROPion (WELLBUTRIN XL)  150 MG 24 hr tablet    Other Visit Diagnoses    Anxiety       Relevant Medications   buPROPion (WELLBUTRIN XL) 150 MG 24 hr tablet    Patient responding well to treatment.  Will continue with current dosing. Discussed use of trazodone for trouble sleeping. She will consider and declines at present due to having a good energy level.   Reviewed precautions for going to the ER, like SI/HI.   Follow up in 4 months, unless she needs to be seen sooner. Will assess for ability to discontinue medication at that time.  15 minutes spent in face to face discussion with > 50% spent in counseling,management, and coordination of care of her anxiety and depression.   Thomasene Mohair, MD 04/05/2019 5:22 PM

## 2019-10-04 ENCOUNTER — Other Ambulatory Visit: Payer: Self-pay | Admitting: Obstetrics and Gynecology

## 2019-10-04 DIAGNOSIS — F321 Major depressive disorder, single episode, moderate: Secondary | ICD-10-CM

## 2019-10-04 DIAGNOSIS — F419 Anxiety disorder, unspecified: Secondary | ICD-10-CM

## 2019-10-04 NOTE — Telephone Encounter (Signed)
Advise, pt's last appointment was 03/2019

## 2019-11-15 ENCOUNTER — Other Ambulatory Visit: Payer: Self-pay | Admitting: Advanced Practice Midwife

## 2019-11-15 DIAGNOSIS — Z30011 Encounter for initial prescription of contraceptive pills: Secondary | ICD-10-CM

## 2019-11-15 NOTE — Telephone Encounter (Signed)
Please advise 

## 2019-11-16 NOTE — Telephone Encounter (Signed)
Advise

## 2019-11-16 NOTE — Telephone Encounter (Signed)
This is SDJ patient- please route to him. Thanks

## 2019-11-21 ENCOUNTER — Other Ambulatory Visit: Payer: Self-pay

## 2019-11-21 DIAGNOSIS — Z30011 Encounter for initial prescription of contraceptive pills: Secondary | ICD-10-CM

## 2019-11-21 MED ORDER — NORETHIN ACE-ETH ESTRAD-FE 1-20 MG-MCG PO TABS: 1.0000 | ORAL_TABLET | Freq: Every day | ORAL | 0 refills | Status: DC

## 2020-01-03 ENCOUNTER — Ambulatory Visit (INDEPENDENT_AMBULATORY_CARE_PROVIDER_SITE_OTHER): Payer: BC Managed Care – PPO | Admitting: Obstetrics and Gynecology

## 2020-01-03 ENCOUNTER — Other Ambulatory Visit: Payer: Self-pay

## 2020-01-03 ENCOUNTER — Encounter: Payer: Self-pay | Admitting: Obstetrics and Gynecology

## 2020-01-03 VITALS — BP 127/79 | HR 81 | Ht 62.0 in | Wt 176.0 lb

## 2020-01-03 DIAGNOSIS — Z01419 Encounter for gynecological examination (general) (routine) without abnormal findings: Secondary | ICD-10-CM | POA: Diagnosis not present

## 2020-01-03 DIAGNOSIS — Z1339 Encounter for screening examination for other mental health and behavioral disorders: Secondary | ICD-10-CM

## 2020-01-03 DIAGNOSIS — Z113 Encounter for screening for infections with a predominantly sexual mode of transmission: Secondary | ICD-10-CM

## 2020-01-03 DIAGNOSIS — Z3041 Encounter for surveillance of contraceptive pills: Secondary | ICD-10-CM

## 2020-01-03 DIAGNOSIS — Z1331 Encounter for screening for depression: Secondary | ICD-10-CM | POA: Diagnosis not present

## 2020-01-03 MED ORDER — LO LOESTRIN FE 1 MG-10 MCG / 10 MCG PO TABS: 1.0000 | ORAL_TABLET | Freq: Every day | ORAL | 4 refills | Status: DC

## 2020-01-03 NOTE — Progress Notes (Signed)
Gynecology Annual Exam   Chief Complaint  Patient presents with  . Gynecologic Exam    Vaginal discharge,no odor, no itching    History of Present Illness:  Ms. Yolanda Evans is a 34 y.o. [id number] who LMP was Patient's last menstrual period was 12/30/2019., presents today for her annual examination.  Her menses are regular every 28-30 days, lasting 4 day(s).  Dysmenorrhea severe, occurring throughout menses. She does not have intermenstrual bleeding.  She is sexually active. She uses combined OCPs for contraception.  Last Pap: 12/28/2018  Results were: no abnormalities /neg HPV DNA negative Hx of STDs: HSV  There is a FH of breast cancer in her paternal aunt. There is no FH of ovarian cancer. The patient does not do self-breast exams.  Tobacco use: former. She quit last September.  Alcohol use: social drinker Exercise: every day.  The patient wears seatbelts: yes.   The patient reports that domestic violence in her life is absent.   She had some symptoms of a yeast infection prior to her period.  It went away after her period. She had some abnormal discharge, but not odor or discharge.    Past Medical History:  Diagnosis Date  . No known health problems     Past Surgical History:  Procedure Laterality Date  . NO PAST SURGERIES      Prior to Admission medications   Medication Sig Start Date End Date Taking? Authorizing Provider  norethindrone-ethinyl estradiol (LOESTRIN FE) 1-20 MG-MCG tablet Take 1 tablet by mouth daily. 11/21/19 11/20/20  Will Bonnet, MD    Allergies  Allergen Reactions  . Sunflower Oil Anxiety   Obstetric History: G4P0040  Social History   Socioeconomic History  . Marital status: Single    Spouse name: Not on file  . Number of children: Not on file  . Years of education: Not on file  . Highest education level: Not on file  Occupational History  . Not on file  Tobacco Use  . Smoking status: Former Smoker    Packs/day: 0.25    Types:  Cigarettes  . Smokeless tobacco: Never Used  Vaping Use  . Vaping Use: Never used  Substance and Sexual Activity  . Alcohol use: Yes  . Drug use: No  . Sexual activity: Yes    Birth control/protection: Pill  Other Topics Concern  . Not on file  Social History Narrative  . Not on file   Social Determinants of Health   Financial Resource Strain:   . Difficulty of Paying Living Expenses:   Food Insecurity:   . Worried About Charity fundraiser in the Last Year:   . Arboriculturist in the Last Year:   Transportation Needs:   . Film/video editor (Medical):   Marland Kitchen Lack of Transportation (Non-Medical):   Physical Activity:   . Days of Exercise per Week:   . Minutes of Exercise per Session:   Stress:   . Feeling of Stress :   Social Connections:   . Frequency of Communication with Friends and Family:   . Frequency of Social Gatherings with Friends and Family:   . Attends Religious Services:   . Active Member of Clubs or Organizations:   . Attends Archivist Meetings:   Marland Kitchen Marital Status:   Intimate Partner Violence:   . Fear of Current or Ex-Partner:   . Emotionally Abused:   Marland Kitchen Physically Abused:   . Sexually Abused:     Family History  Problem Relation Age of Onset  . Breast cancer Paternal Grandmother        no contact    Review of Systems  Constitutional: Negative.   HENT: Negative.   Eyes: Negative.   Respiratory: Negative.   Cardiovascular: Negative.   Gastrointestinal: Negative.   Genitourinary: Negative.   Musculoskeletal: Negative.   Skin: Negative.   Neurological: Negative.   Psychiatric/Behavioral: Negative.      Physical Exam BP 127/79   Pulse 81   Ht 5\' 2"  (1.575 m)   Wt 176 lb (79.8 kg)   LMP 12/30/2019   BMI 32.19 kg/m    Physical Exam Constitutional:      General: She is not in acute distress.    Appearance: Normal appearance. She is well-developed.  Genitourinary:     Pelvic exam was performed with patient in the  lithotomy position.     Vulva, urethra, bladder and uterus normal.     No inguinal adenopathy present in the right or left side.    No signs of injury in the vagina.     No vaginal discharge, erythema, tenderness or bleeding.     No cervical motion tenderness, discharge, lesion or polyp.     Uterus is mobile.     Uterus is not enlarged or tender.     No uterine mass detected.    Uterus is anteverted.     No right or left adnexal mass present.     Right adnexa not tender or full.     Left adnexa not tender or full.  HENT:     Head: Normocephalic and atraumatic.  Eyes:     General: No scleral icterus.    Conjunctiva/sclera: Conjunctivae normal.  Neck:     Thyroid: No thyromegaly.  Cardiovascular:     Rate and Rhythm: Normal rate and regular rhythm.     Heart sounds: No murmur heard.  No friction rub. No gallop.   Pulmonary:     Effort: Pulmonary effort is normal. No respiratory distress.     Breath sounds: Normal breath sounds. No wheezing or rales.  Chest:     Breasts:        Right: No inverted nipple, mass, nipple discharge, skin change or tenderness.        Left: No inverted nipple, mass, nipple discharge, skin change or tenderness.  Abdominal:     General: Bowel sounds are normal. There is no distension.     Palpations: Abdomen is soft. There is no mass.     Tenderness: There is no abdominal tenderness. There is no guarding or rebound.  Musculoskeletal:        General: No swelling or tenderness. Normal range of motion.     Cervical back: Normal range of motion and neck supple.  Lymphadenopathy:     Cervical: No cervical adenopathy.     Lower Body: No right inguinal adenopathy. No left inguinal adenopathy.  Neurological:     General: No focal deficit present.     Mental Status: She is alert and oriented to person, place, and time.     Cranial Nerves: No cranial nerve deficit.  Skin:    General: Skin is warm and dry.     Findings: No erythema or rash.  Psychiatric:         Mood and Affect: Mood normal.        Behavior: Behavior normal.        Judgment: Judgment normal.   Female chaperone present for pelvic  and breast  portions of the physical exam  Results:  Wet Prep: PH: 4.5 Clue Cells: Negative Fungal elements: Negative Trichomonas: Negative   AUDIT Questionnaire (screen for alcoholism): 5 PHQ-9: 4   Assessment: 34 y.o. G58P0040 female here for routine annual gynecologic examination  Plan: Problem List Items Addressed This Visit    None    Visit Diagnoses    Women's annual routine gynecological examination    -  Primary   Relevant Medications   Norethindrone-Ethinyl Estradiol-Fe Biphas (LO LOESTRIN FE) 1 MG-10 MCG / 10 MCG tablet   Screening for depression       Screening for alcoholism       Screen for STD (sexually transmitted disease)       Encounter for surveillance of contraceptive pills       Relevant Medications   Norethindrone-Ethinyl Estradiol-Fe Biphas (LO LOESTRIN FE) 1 MG-10 MCG / 10 MCG tablet      Screening: -- Blood pressure screen normal -- Weight screening: overweight: continue to monitor -- Depression screening negative (PHQ-9) -- Nutrition: normal -- cholesterol screening: not due for screening -- osteoporosis screening: not due -- tobacco screening: not using -- alcohol screening: AUDIT questionnaire indicates low-risk usage. -- family history of breast cancer screening: done. not at high risk. -- no evidence of domestic violence or intimate partner violence. -- STD screening: gonorrhea/chlamydia NAAT not collected per patient request. -- pap smear not collected per ASCCP guidelines  Rx for lo loestrin.   Thomasene Mohair, MD 01/03/2020 4:40 PM

## 2020-01-22 ENCOUNTER — Other Ambulatory Visit: Payer: Self-pay

## 2020-01-22 ENCOUNTER — Ambulatory Visit
Admission: RE | Admit: 2020-01-22 | Discharge: 2020-01-22 | Disposition: A | Discharge status: Home / Self Care | Payer: BC Managed Care – PPO | Source: Ambulatory Visit | Attending: Family Medicine | Admitting: Family Medicine

## 2020-01-22 VITALS — BP 109/71 | HR 100 | Temp 98.8°F | Resp 17 | Ht 62.0 in | Wt 172.0 lb

## 2020-01-22 DIAGNOSIS — R52 Pain, unspecified: Secondary | ICD-10-CM | POA: Diagnosis not present

## 2020-01-22 DIAGNOSIS — R05 Cough: Secondary | ICD-10-CM | POA: Diagnosis not present

## 2020-01-22 DIAGNOSIS — R6883 Chills (without fever): Secondary | ICD-10-CM | POA: Diagnosis not present

## 2020-01-22 DIAGNOSIS — J029 Acute pharyngitis, unspecified: Secondary | ICD-10-CM | POA: Diagnosis present

## 2020-01-22 DIAGNOSIS — Z79899 Other long term (current) drug therapy: Secondary | ICD-10-CM | POA: Insufficient documentation

## 2020-01-22 DIAGNOSIS — Z20822 Contact with and (suspected) exposure to covid-19: Secondary | ICD-10-CM | POA: Diagnosis not present

## 2020-01-22 LAB — SARS CORONAVIRUS 2 (TAT 6-24 HRS): SARS Coronavirus 2: NEGATIVE

## 2020-01-22 LAB — GROUP A STREP BY PCR: Group A Strep by PCR: NOT DETECTED

## 2020-01-22 MED ORDER — FLUCONAZOLE 150 MG PO TABS
150.0000 mg | ORAL_TABLET | Freq: Every day | ORAL | 0 refills | Status: DC
Start: 2020-01-22 — End: 2020-04-19

## 2020-01-22 MED ORDER — AMOXICILLIN 875 MG PO TABS
875.0000 mg | ORAL_TABLET | Freq: Two times a day (BID) | ORAL | 0 refills | Status: DC
Start: 2020-01-22 — End: 2020-04-19

## 2020-01-22 MED ORDER — LIDOCAINE VISCOUS HCL 2 % MT SOLN
10.0000 mL | Freq: Four times a day (QID) | OROMUCOSAL | 0 refills | Status: DC | PRN
Start: 2020-01-22 — End: 2020-04-19

## 2020-01-22 NOTE — Discharge Instructions (Addendum)
Take medication as prescribed. Rest. Drink plenty of fluids. Ibuprofen as needed.   Follow up with your primary care physician this week as needed. Return to Urgent care for new or worsening concerns.

## 2020-01-22 NOTE — ED Provider Notes (Signed)
[facility] ____________________________________________  Time seen: Approximately 11:35 AM  I have reviewed the triage vital signs and the nursing notes.   HISTORY  Chief Complaint Sore Throat and Generalized Body Aches   HPI Yolanda Evans is a 34 y.o. female presenting for evaluation of sore throat present for the last 2 days with chills and generalized body aches.  Denies known fevers.  States the chills come and go.  Denies any accompanying cough, congestion, chest pain or shortness of breath, vomiting, diarrhea, change in taste or smell.  States sore throat is persisting and feels like swallowing razor blades.  Able to still eat and drink but painful.  Has taken over-the-counter ibuprofen and Tylenol as well as cough congestion medication without resolution.  Denies known sick contacts.  Does not share drinks after others.  Significant other not sick with similar.  Has had COVID-19 vaccines.  Reports otherwise doing well.  Denies other recent sickness.  Patient's last menstrual period was 12/30/2019.     Past Medical History:  Diagnosis Date  . No known health problems     Patient Active Problem List   Diagnosis Date Noted  . Depression 12/28/2018    Past Surgical History:  Procedure Laterality Date  . NO PAST SURGERIES       No current facility-administered medications for this encounter.  Current Outpatient Medications:  .  amoxicillin (AMOXIL) 875 MG tablet, Take 1 tablet (875 mg total) by mouth 2 (two) times daily., Disp: 20 tablet, Rfl: 0 .  fluconazole (DIFLUCAN) 150 MG tablet, Take 1 tablet (150 mg total) by mouth daily. Take one pill orally as needed., Disp: 1 tablet, Rfl: 0 .  lidocaine (XYLOCAINE) 2 % solution, Use as directed 10 mLs in the mouth or throat every 6 (six) hours as needed (sore throat. gargle and spit as needed for sore throat.)., Disp: 100 mL, Rfl: 0 .  Norethindrone-Ethinyl Estradiol-Fe Biphas (LO LOESTRIN FE) 1 MG-10 MCG / 10 MCG  tablet, Take 1 tablet by mouth daily., Disp: 84 tablet, Rfl: 4  Allergies Sunflower oil  Family History  Problem Relation Age of Onset  . Breast cancer Paternal Grandmother        no contact    Social History Social History   Tobacco Use  . Smoking status: Former Smoker    Packs/day: 0.25    Types: Cigarettes  . Smokeless tobacco: Never Used  Vaping Use  . Vaping Use: Never used  Substance Use Topics  . Alcohol use: Yes    Comment: social  . Drug use: No    Review of Systems Constitutional: Positive chills Eyes: No visual changes. ENT: Positive sore throat. Cardiovascular: Denies chest pain. Respiratory: Denies shortness of breath. Gastrointestinal: No abdominal pain.  No nausea, no vomiting.  No diarrhea.  Musculoskeletal: Negative for back pain. Skin: Negative for rash.   ____________________________________________   PHYSICAL EXAM:  VITAL SIGNS: ED Triage Vitals  Enc Vitals Group     BP 01/22/20 1044 109/71     Pulse Rate 01/22/20 1044 100     Resp 01/22/20 1044 17     Temp 01/22/20 1044 98.8 F (37.1 C)     Temp Source 01/22/20 1044 Oral     SpO2 01/22/20 1044 100 %     Weight 01/22/20 1042 171 lb 15.3 oz (78 kg)     Height 01/22/20 1042 5\' 2"  (1.575 m)     Head Circumference --      Peak Flow --  Pain Score 01/22/20 1042 8     Pain Loc --      Pain Edu? --      Excl. in GC? --     Constitutional: Alert and oriented. Well appearing and in no acute distress. Eyes: Conjunctivae are normal.  ENT      Head: Normocephalic and atraumatic.      Nose: No congestion      Mouth/Throat: Mucous membranes are [email or web address].pt to moderate pharyngeal erythema.  2+ bilateral tonsillar swelling with bilateral exudate.  No uvular shift or deviation. Neck: No stridor. Supple without meningismus.  Hematological/Lymphatic/Immunilogical: No cervical lymphadenopathy. Cardiovascular: Normal rate, regular rhythm. Grossly normal heart sounds.  Good peripheral  circulation. Respiratory: Normal respiratory effort without tachypnea nor retractions. Breath sounds are clear and equal bilaterally. No wheezes, rales, rhonchi. Musculoskeletal: Steady gait Neurologic:  Normal speech and language.  Skin:  Skin is warm, dry and intact. No rash noted. Psychiatric: Mood and affect are normal. Speech and behavior are normal. Patient exhibits appropriate insight and judgment   ___________________________________________   LABS (all labs ordered are listed, but only abnormal results are displayed)  Labs Reviewed  GROUP A STREP BY PCR  SARS CORONAVIRUS 2 (TAT 6-24 HRS)    PROCEDURES  INITIAL IMPRESSION / ASSESSMENT AND PLAN / ED COURSE  Pertinent labs & imaging results that were available during my care of the patient were reviewed by me and considered in my medical decision making (see chart for details).  Well-appearing patient.  No acute distress.  Strep PCR negative.  COVID-19 testing sent.  Patient active tonsillitis, concerning for infectious etiology.  Will treat with oral amoxicillin, PR viscous lidocaine.  Patient request Diflucan, as given.  Supportive care, gargles.  Over-the-counter ibuprofen.Discussed indication, risks and benefits of medications with patient.   Discussed follow up with Primary care physician this week. Discussed follow up and return parameters including no resolution or any worsening concerns. Patient verbalized understanding and agreed to plan.   ____________________________________________   FINAL CLINICAL IMPRESSION(S) / ED DIAGNOSES  Final diagnoses:  Pharyngitis, unspecified etiology     ED Discharge Orders         Ordered    amoxicillin (AMOXIL) 875 MG tablet  2 times daily     Discontinue  Reprint     01/22/20 1127    lidocaine (XYLOCAINE) 2 % solution  Every 6 hours PRN     Discontinue  Reprint     01/22/20 1127    fluconazole (DIFLUCAN) 150 MG tablet  Daily     Discontinue  Reprint     01/22/20 1127            Note: This dictation was prepared with Dragon dictation along with smaller phrase technology. Any transcriptional errors that result from this process are unintentional.         Renford Dills, NP 01/22/20 1240

## 2020-01-22 NOTE — ED Triage Notes (Signed)
Pt with throat pain and generalized body aches starting over the weekend. Endorses chills.

## 2020-02-11 ENCOUNTER — Other Ambulatory Visit: Payer: Self-pay | Admitting: Obstetrics and Gynecology

## 2020-02-11 DIAGNOSIS — Z01419 Encounter for gynecological examination (general) (routine) without abnormal findings: Secondary | ICD-10-CM

## 2020-02-11 DIAGNOSIS — Z3041 Encounter for surveillance of contraceptive pills: Secondary | ICD-10-CM

## 2020-02-12 NOTE — Telephone Encounter (Signed)
Pt had annual 12/2019

## 2020-04-19 ENCOUNTER — Ambulatory Visit (INDEPENDENT_AMBULATORY_CARE_PROVIDER_SITE_OTHER): Payer: BC Managed Care – PPO | Admitting: Obstetrics

## 2020-04-19 ENCOUNTER — Encounter: Payer: Self-pay | Admitting: Obstetrics

## 2020-04-19 ENCOUNTER — Other Ambulatory Visit: Payer: Self-pay

## 2020-04-19 VITALS — BP 110/70 | HR 72 | Ht 62.0 in | Wt 182.0 lb

## 2020-04-19 DIAGNOSIS — N912 Amenorrhea, unspecified: Secondary | ICD-10-CM

## 2020-04-19 DIAGNOSIS — Z113 Encounter for screening for infections with a predominantly sexual mode of transmission: Secondary | ICD-10-CM | POA: Diagnosis not present

## 2020-04-19 DIAGNOSIS — Z23 Encounter for immunization: Secondary | ICD-10-CM

## 2020-04-19 LAB — POCT URINE PREGNANCY: Preg Test, Ur: NEGATIVE

## 2020-04-19 NOTE — Progress Notes (Signed)
Gynecology STD Evaluation   hChief Complaint:  Chief Complaint  Yolanda Evans presents with  . Gynecologic Exam    ex tested positive for chlamydia; preg test    History of Present Illness: Yolanda Evans is a 34 y.o. G4P0040 presents for STD testing.  The Yolanda Evans has not noted intermenstrual spotting,  has not experienced postcoital bleeding, and does not report increased vaginal discharge.  There is not a history of prior sexually transmitted infection(s).  Her bayfriend called her today and told her that he has Chlamydia. She is visibly upset and tearful. They have been partnered for over a year, and she suspects he has been unfaithful to her.   The Yolanda Evans is sexually active and reports several lifetime sexual partners . She currently uses Oral contraceptives: Present: yes for contraception. She occasionally uses condoms or barrier methods to prevent the spread of sexually transmitted infections.  The Yolanda Evans has not been previously transfused or tattooed.    Review of Systems: 10 point review of systems negative unless otherwise noted in HPI  Past Medical History:  Past Medical History:  Diagnosis Date  . No known health problems     Past Surgical History:  Past Surgical History:  Procedure Laterality Date  . NO PAST SURGERIES      Gynecologic History: Yolanda Evans's last menstrual period was 02/25/2020 (approximate).  Obstetric History: G4P0040  Family History:  Family History  Problem Relation Age of Onset  . Breast cancer Paternal Grandmother        no contact    Social History:  Social History   Socioeconomic History  . Marital status: Single    Spouse name: Not on file  . Number of children: Not on file  . Years of education: Not on file  . Highest education level: Not on file  Occupational History  . Not on file  Tobacco Use  . Smoking status: Former Smoker    Packs/day: 0.25    Types: Cigarettes  . Smokeless tobacco: Never Used  Vaping Use  . Vaping Use: Never  used  Substance and Sexual Activity  . Alcohol use: Yes    Comment: social  . Drug use: No  . Sexual activity: Yes    Birth control/protection: Pill  Other Topics Concern  . Not on file  Social History Narrative  . Not on file   Social Determinants of Health   Financial Resource Strain:   . Difficulty of Paying Living Expenses: Not on file  Food Insecurity:   . Worried About Programme researcher, broadcasting/film/video in the Last Year: Not on file  . Ran Out of Food in the Last Year: Not on file  Transportation Needs:   . Lack of Transportation (Medical): Not on file  . Lack of Transportation (Non-Medical): Not on file  Physical Activity:   . Days of Exercise per Week: Not on file  . Minutes of Exercise per Session: Not on file  Stress:   . Feeling of Stress : Not on file  Social Connections:   . Frequency of Communication with Friends and Family: Not on file  . Frequency of Social Gatherings with Friends and Family: Not on file  . Attends Religious Services: Not on file  . Active Member of Clubs or Organizations: Not on file  . Attends Banker Meetings: Not on file  . Marital Status: Not on file  Intimate Partner Violence:   . Fear of Current or Ex-Partner: Not on file  . Emotionally Abused: Not on  file  . Physically Abused: Not on file  . Sexually Abused: Not on file    Allergies:  Allergies  Allergen Reactions  . Sunflower Oil Anxiety and Hives    Exposure to oil, pt gets hives. Ingestion of sunflower seeds .Marland Kitchen Pt has anaphylaxis    Medications: Prior to Admission medications   Medication Sig Start Date End Date Taking? Authorizing Provider  LO LOESTRIN FE 1 MG-10 MCG / 10 MCG tablet TAKE 1 TABLET BY MOUTH DAILY 02/13/20 05/07/20 Yes Conard Novak, MD    Physical Exam Blood pressure 110/70, pulse 72, height 5\' 2"  (1.575 m), weight 182 lb (82.6 kg), last menstrual period 02/25/2020. Yolanda Evans's last menstrual period was 02/25/2020 (approximate).  General: NAD HEENT:  normocephalic, anicteric Genitourinary:  External: Normal external female genitalia.  Normal urethral meatus, normal  Bartholin's and Skene's glands.    Vagina: Normal vaginal mucosa, no evidence of prolapse.    Cervix: Grossly normal in appearance, no bleeding  Uterus: Non-enlarged, mobile, normal contour.  No CMT  Adnexa: ovaries non-enlarged, no adnexal masses  Rectal: deferred  Lymphatic: no evidence of inguinal lymphadenopathy Extremities: no edema, erythema, or tenderness Neurologic: Grossly intact Psychiatric: mood appropriate, affect full  Female chaperone present for pelvic portions of the physical exam  Assessment: 34 y.o. G4P0040 presenting for STD screening Plan: Problem List Items Addressed This Visit    None    Visit Diagnoses    Need for diphtheria-tetanus-pertussis (Tdap) vaccine    -  Primary   Relevant Orders   Tdap vaccine greater than or equal to 7yo IM (Completed)   Absence of menstruation       Relevant Orders   POCT urine pregnancy (Completed)   Routine screening for STI (sexually transmitted infection)       Relevant Orders   GC/Chlamydia Probe Amp       1Patient will continue to use OCPs as her current form of contraception", 2) Full STI screening was offered and accepted  As she has been notified that her BF has Chlamydia, an Aptima test is retrieved today. 3. We wil contact her with the testing results and treat appropriately if needed. 4) Return if symptoms worsen or fail to improve.  36, CNM  04/19/2020 5:23 PM   [facility], [facility] 04/19/2020, 5:16 PM

## 2020-04-24 LAB — GC/CHLAMYDIA PROBE AMP
Chlamydia trachomatis, NAA: POSITIVE — AB
Neisseria Gonorrhoeae by PCR: NEGATIVE

## 2020-04-26 ENCOUNTER — Other Ambulatory Visit: Payer: Self-pay | Admitting: Obstetrics

## 2020-04-26 DIAGNOSIS — A749 Chlamydial infection, unspecified: Secondary | ICD-10-CM

## 2020-04-26 MED ORDER — AZITHROMYCIN 500 MG PO TABS: 1000.0000 mg | ORAL_TABLET | Freq: Once | ORAL | 0 refills | Status: AC

## 2020-04-26 NOTE — Progress Notes (Signed)
The patient was called by phone and informed of her +chlamydia results. A prescription for Azithromycin 1gm has been sent to the pharmacy.education regarding transmission, treatment, and the need to refrain from IC for at least 7 days after treatment is shared with her.

## 2020-05-14 ENCOUNTER — Ambulatory Visit: Payer: BC Managed Care – PPO | Admitting: Obstetrics and Gynecology

## 2020-08-04 ENCOUNTER — Other Ambulatory Visit: Payer: Self-pay | Admitting: Obstetrics and Gynecology

## 2020-08-04 DIAGNOSIS — F419 Anxiety disorder, unspecified: Secondary | ICD-10-CM

## 2020-08-04 DIAGNOSIS — F321 Major depressive disorder, single episode, moderate: Secondary | ICD-10-CM

## 2020-12-10 ENCOUNTER — Other Ambulatory Visit (HOSPITAL_COMMUNITY)
Admission: RE | Admit: 2020-12-10 | Discharge: 2020-12-10 | Disposition: A | Discharge status: Home / Self Care | Payer: BC Managed Care – PPO | Source: Ambulatory Visit | Attending: Obstetrics | Admitting: Obstetrics

## 2020-12-10 ENCOUNTER — Encounter: Payer: Self-pay | Admitting: Obstetrics

## 2020-12-10 ENCOUNTER — Other Ambulatory Visit: Payer: Self-pay

## 2020-12-10 ENCOUNTER — Ambulatory Visit (INDEPENDENT_AMBULATORY_CARE_PROVIDER_SITE_OTHER): Payer: BC Managed Care – PPO | Admitting: Obstetrics

## 2020-12-10 VITALS — BP 122/70 | Ht 62.0 in | Wt 199.6 lb

## 2020-12-10 DIAGNOSIS — Z113 Encounter for screening for infections with a predominantly sexual mode of transmission: Secondary | ICD-10-CM | POA: Diagnosis not present

## 2020-12-10 DIAGNOSIS — N949 Unspecified condition associated with female genital organs and menstrual cycle: Secondary | ICD-10-CM | POA: Insufficient documentation
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
## 2020-12-12 LAB — CERVICOVAGINAL ANCILLARY ONLY
Bacterial Vaginitis (gardnerella): POSITIVE — AB
Chlamydia: NEGATIVE
Comment: NEGATIVE
Comment: NEGATIVE
Comment: NORMAL
Neisseria Gonorrhea: NEGATIVE

## 2020-12-13 ENCOUNTER — Encounter: Payer: Self-pay | Admitting: Obstetrics

## 2020-12-13 ENCOUNTER — Other Ambulatory Visit: Payer: Self-pay | Admitting: Obstetrics

## 2020-12-13 DIAGNOSIS — B9689 Other specified bacterial agents as the cause of diseases classified elsewhere: Secondary | ICD-10-CM

## 2020-12-13 MED ORDER — METRONIDAZOLE 500 MG PO TABS: 500.0000 mg | ORAL_TABLET | Freq: Two times a day (BID) | ORAL | 0 refills | Status: AC

## 2020-12-19 ENCOUNTER — Other Ambulatory Visit: Payer: Self-pay

## 2020-12-23 ENCOUNTER — Ambulatory Visit (INDEPENDENT_AMBULATORY_CARE_PROVIDER_SITE_OTHER): Payer: BC Managed Care – PPO | Admitting: Obstetrics and Gynecology

## 2020-12-23 ENCOUNTER — Other Ambulatory Visit: Payer: Self-pay

## 2020-12-23 VITALS — BP 108/68 | HR 85 | Ht 62.0 in | Wt 195.0 lb

## 2020-12-23 DIAGNOSIS — N949 Unspecified condition associated with female genital organs and menstrual cycle: Secondary | ICD-10-CM | POA: Diagnosis not present

## 2020-12-23 NOTE — Progress Notes (Signed)
Obstetrics & Gynecology Office Visit   Chief Complaint  Patient presents with   Gynecologic Exam    Vaginal lump/Cyst? It has gotten smaller since 5/31 visit. Debated on cancelling appointment.    History of Present Illness: 35 y.o. G49P0040 female who presents for a vaginal lump. The lump was noticed 3 weeks ago. Recent treatment for BV with metronidazole.  The lump is located on the inside on the left side.    She had negative STI screening 2 weeks ago.  She finished metronidazole yesterday.  The area is tender to the touch. No pain with intercourse.  She had a watery discharge 3 days ago. After this the size of the lump was about 1/4 the size it was prior and became firm.  She had never had anything like this before.  She did have chlamydia about 8 months ago. She thinks she might be experiencing a yeast infection secondary to the metronidazole.   Past Medical History:  Diagnosis Date   No known health problems     Past Surgical History:  Procedure Laterality Date   NO PAST SURGERIES      Gynecologic History: Patient's last menstrual period was 12/11/2020 (exact date).  Obstetric History: G4P0040  Family History  Problem Relation Age of Onset   Breast cancer Paternal Grandmother        no contact    Social History   Socioeconomic History   Marital status: Single    Spouse name: Not on file   Number of children: Not on file   Years of education: Not on file   Highest education level: Not on file  Occupational History   Not on file  Tobacco Use   Smoking status: Former    Packs/day: 0.25    Pack years: 0.00    Types: Cigarettes   Smokeless tobacco: Never  Vaping Use   Vaping Use: Never used  Substance and Sexual Activity   Alcohol use: Yes    Comment: social   Drug use: No   Sexual activity: Yes    Birth control/protection: Pill  Other Topics Concern   Not on file  Social History Narrative   Not on file   Social Determinants of Health   Financial  Resource Strain: Not on file  Food Insecurity: Not on file  Transportation Needs: Not on file  Physical Activity: Not on file  Stress: Not on file  Social Connections: Not on file  Intimate Partner Violence: Not on file    Allergies  Allergen Reactions   Sunflower Oil Anxiety and Hives    Exposure to oil, pt gets hives. Ingestion of sunflower seeds .Marland Kitchen Pt has anaphylaxis    Prior to Admission medications   Medication Sig Start Date End Date Taking? Authorizing Provider  pantoprazole (PROTONIX) 40 MG tablet Take 1 tablet by mouth daily. Patient not taking: Reported on 12/23/2020 11/28/20 01/23/21  [provider]    Review of Systems  Constitutional: Negative.   HENT: Negative.    Eyes: Negative.   Respiratory: Negative.    Cardiovascular: Negative.   Gastrointestinal: Negative.   Genitourinary: Negative.        See HPI  Musculoskeletal: Negative.   Skin: Negative.   Neurological: Negative.   Psychiatric/Behavioral: Negative.      Physical Exam BP 108/68 (Cuff Size: Large)   Pulse 85   Ht 5\' 2"  (1.575 m)   Wt 195 lb (88.5 kg)   LMP 12/11/2020 (Exact Date)   BMI 35.67 kg/m  Patient's last menstrual period was 12/11/2020 (exact date). Physical Exam Constitutional:      General: She is not in acute distress.    Appearance: Normal appearance.  Genitourinary:     Bladder and urethral meatus normal.     There are lesions (pea sized lesion about 3 cm proximal to hymenal ring, nearly midline, firm, non-tender. not visible on exam.) in the vagina.     Right Labia: No rash, tenderness, lesions or skin changes.    Left Labia: No tenderness, lesions, skin changes or rash.    No inguinal adenopathy present in the right or left side.    Pelvic Tanner Score: 5/5.    No vaginal discharge, erythema, bleeding or ulceration.     No vaginal prolapse present.     Right Adnexa: not tender, not full and no mass present.    Left Adnexa: not tender, not full and no mass  present.    No cervical motion tenderness, discharge, friability, lesion or polyp.     Uterus is not enlarged, fixed or tender.     Uterus is anteverted.  HENT:     Head: Normocephalic and atraumatic.  Eyes:     General: No scleral icterus.    Conjunctiva/sclera: Conjunctivae normal.  Lymphadenopathy:     Lower Body: No right inguinal adenopathy. No left inguinal adenopathy.  Neurological:     General: No focal deficit present.     Mental Status: She is alert and oriented to person, place, and time.     Cranial Nerves: No cranial nerve deficit.  Psychiatric:        Mood and Affect: Mood normal.        Behavior: Behavior normal.        Judgment: Judgment normal.   Female chaperone present for pelvic and breast  portions of the physical exam  Wet Prep: PH: n/a Clue Cells: Negative Fungal elements: Negative Trichomonas: Negative   Assessment: 35 y.o. G73P0040 female here for  1. Vaginal lump      Plan: Problem List Items Addressed This Visit       Other   Vaginal lump - Primary   Continue to monitor. If does not resolve within 2-3 months, may need to consider biopsy.  No evident etiology.  If the lump returns, then return to clinic for evaluation.  Based on location is not a Gartner duct cyst or Bartholin gland cyst.   A total of 23 minutes were spent face-to-face with the patient as well as preparation, review, communication, and documentation during this encounter.    Thomasene Mohair, MD 12/23/2020 3:34 PM

## 2020-12-24 ENCOUNTER — Encounter: Payer: Self-pay | Admitting: Obstetrics and Gynecology

## 2022-12-02 NOTE — Progress Notes (Unsigned)
   There were no vitals taken for this visit.   Subjective:    Patient ID: Yolanda Evans, female    DOB: 1985/12/13, 37 y.o.   MRN: [id number]  HPI: Yolanda Evans is a 37 y.o. female  No chief complaint on file.  Establish care: her last physical was ***.  Medical history includes ***.  Family history includes ***.  Health maintenance ***.   Relevant past medical, surgical, family and social history reviewed and updated as indicated. Interim medical history since our last visit reviewed. Allergies and medications reviewed and updated.  Review of Systems  Constitutional: Negative for fever or weight change.  Respiratory: Negative for cough and shortness of breath.   Cardiovascular: Negative for chest pain or palpitations.  Gastrointestinal: Negative for abdominal pain, no bowel changes.  Musculoskeletal: Negative for gait problem or joint swelling.  Skin: Negative for rash.  Neurological: Negative for dizziness or headache.  No other specific complaints in a complete review of systems (except as listed in HPI above).      Objective:    There were no vitals taken for this visit.  Wt Readings from Last 3 Encounters:  12/23/20 195 lb (88.5 kg)  12/10/20 199 lb 9.6 oz (90.5 kg)  04/19/20 182 lb (82.6 kg)    Physical Exam  Constitutional: Patient appears well-developed and well-nourished. Obese *** No distress.  HEENT: head atraumatic, normocephalic, pupils equal and reactive to light, ears ***, neck supple, throat within normal limits Cardiovascular: Normal rate, regular rhythm and normal heart sounds.  No murmur heard. No BLE edema. Pulmonary/Chest: Effort normal and breath sounds normal. No respiratory distress. Abdominal: Soft.  There is no tenderness. Psychiatric: Patient has a normal mood and affect. behavior is normal. Judgment and thought content normal.      Assessment & Plan:   Problem List Items Addressed This Visit   None    Follow up plan: No follow-ups on  file.

## 2022-12-03 ENCOUNTER — Ambulatory Visit (INDEPENDENT_AMBULATORY_CARE_PROVIDER_SITE_OTHER): Payer: Managed Care, Other (non HMO) | Admitting: Nurse Practitioner

## 2022-12-03 ENCOUNTER — Other Ambulatory Visit: Payer: Self-pay

## 2022-12-03 ENCOUNTER — Encounter: Payer: Self-pay | Admitting: Nurse Practitioner

## 2022-12-03 VITALS — BP 110/72 | HR 98 | Temp 97.8°F | Resp 18 | Ht 62.0 in | Wt 208.2 lb

## 2022-12-03 DIAGNOSIS — F419 Anxiety disorder, unspecified: Secondary | ICD-10-CM

## 2022-12-03 DIAGNOSIS — Z131 Encounter for screening for diabetes mellitus: Secondary | ICD-10-CM | POA: Diagnosis not present

## 2022-12-03 DIAGNOSIS — Z7689 Persons encountering health services in other specified circumstances: Secondary | ICD-10-CM | POA: Diagnosis not present

## 2022-12-03 DIAGNOSIS — F331 Major depressive disorder, recurrent, moderate: Secondary | ICD-10-CM

## 2022-12-03 DIAGNOSIS — Z114 Encounter for screening for human immunodeficiency virus [HIV]: Secondary | ICD-10-CM

## 2022-12-03 DIAGNOSIS — Z6838 Body mass index (BMI) 38.0-38.9, adult: Secondary | ICD-10-CM

## 2022-12-03 DIAGNOSIS — Z13220 Encounter for screening for lipoid disorders: Secondary | ICD-10-CM

## 2022-12-03 DIAGNOSIS — E66812 Obesity, class 2: Secondary | ICD-10-CM

## 2022-12-03 DIAGNOSIS — K219 Gastro-esophageal reflux disease without esophagitis: Secondary | ICD-10-CM

## 2022-12-03 DIAGNOSIS — Z1159 Encounter for screening for other viral diseases: Secondary | ICD-10-CM

## 2022-12-03 DIAGNOSIS — Z130 Encounter for screening for diseases of the blood and blood-forming organs and certain disorders involving the immune mechanism: Secondary | ICD-10-CM

## 2022-12-03 MED ORDER — BUPROPION HCL ER (XL) 150 MG PO TB24: 150.0000 mg | ORAL_TABLET | Freq: Every day | ORAL | 0 refills | Status: DC

## 2022-12-03 NOTE — Assessment & Plan Note (Signed)
Continue to avoid food triggers.

## 2022-12-03 NOTE — Assessment & Plan Note (Signed)
Will restart patient on Wellbutrin 150 mg daily.  Patient to follow-up in 4 weeks. 

## 2022-12-03 NOTE — Assessment & Plan Note (Signed)
Patient reports that her lifestyle has changed a lot recently.  She does try to eat healthy and well-balanced.  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  Patient is getting continue to work on this.

## 2022-12-03 NOTE — Assessment & Plan Note (Signed)
Will restart patient on Wellbutrin 150 mg daily.  Patient to follow-up in 4 weeks.

## 2022-12-05 LAB — COMPLETE METABOLIC PANEL WITH GFR
AG Ratio: 1.8 (calc) (ref 1.0–2.5)
ALT: 16 U/L (ref 6–29)
AST: 19 U/L (ref 10–30)
Albumin: 4.4 g/dL (ref 3.6–5.1)
Alkaline phosphatase (APISO): 56 U/L (ref 31–125)
BUN: 12 mg/dL (ref 7–25)
CO2: 28 mmol/L (ref 20–32)
Calcium: 9.2 mg/dL (ref 8.6–10.2)
Chloride: 105 mmol/L (ref 98–110)
Creat: 0.69 mg/dL (ref 0.50–0.97)
Globulin: 2.5 g/dL (calc) (ref 1.9–3.7)
Glucose, Bld: 106 mg/dL — ABNORMAL HIGH (ref 65–99)
Potassium: 4.6 mmol/L (ref 3.5–5.3)
Sodium: 139 mmol/L (ref 135–146)
Total Bilirubin: 0.9 mg/dL (ref 0.2–1.2)
Total Protein: 6.9 g/dL (ref 6.1–8.1)
eGFR: 116 mL/min/{1.73_m2} (ref >=60)

## 2022-12-05 LAB — LIPID PANEL
Cholesterol: 194 mg/dL (ref <200)
HDL: 71 mg/dL (ref >=50)
LDL Cholesterol (Calc): 106 mg/dL (calc) — ABNORMAL HIGH
Non-HDL Cholesterol (Calc): 123 mg/dL (calc) (ref <130)
Total CHOL/HDL Ratio: 2.7 (calc) (ref <5.0)
Triglycerides: 83 mg/dL (ref <150)

## 2022-12-05 LAB — TSH: TSH: 1.08 mIU/L

## 2022-12-05 LAB — CBC WITH DIFFERENTIAL/PLATELET
Absolute Monocytes: 562 cells/uL (ref 200–950)
Basophils Absolute: 37 cells/uL (ref 0–200)
Basophils Relative: 0.5 %
Eosinophils Absolute: 111 cells/uL (ref 15–500)
Eosinophils Relative: 1.5 %
HCT: 41.7 % (ref 35.0–45.0)
Hemoglobin: 13.8 g/dL (ref 11.7–15.5)
Lymphs Abs: 3019 cells/uL (ref 850–3900)
MCH: 30.4 pg (ref 27.0–33.0)
MCHC: 33.1 g/dL (ref 32.0–36.0)
MCV: 91.9 fL (ref 80.0–100.0)
MPV: 12.7 fL — ABNORMAL HIGH (ref 7.5–12.5)
Monocytes Relative: 7.6 %
Neutro Abs: 3670 cells/uL (ref 1500–7800)
Neutrophils Relative %: 49.6 %
Platelets: 220 10*3/uL (ref 140–400)
RBC: 4.54 10*6/uL (ref 3.80–5.10)
RDW: 12.9 % (ref 11.0–15.0)
Total Lymphocyte: 40.8 %
WBC: 7.4 10*3/uL (ref 3.8–10.8)

## 2022-12-05 LAB — HEMOGLOBIN A1C
Hgb A1c MFr Bld: 5.4 % of total Hgb (ref <5.7)
Mean Plasma Glucose: 108 mg/dL
eAG (mmol/L): 6 mmol/L

## 2022-12-05 LAB — HIV ANTIBODY (ROUTINE TESTING W REFLEX): HIV 1&2 Ab, 4th Generation: NONREACTIVE

## 2022-12-05 LAB — HEPATITIS C ANTIBODY: Hepatitis C Ab: NONREACTIVE

## 2022-12-31 ENCOUNTER — Other Ambulatory Visit: Payer: Self-pay | Admitting: Nurse Practitioner

## 2022-12-31 DIAGNOSIS — F331 Major depressive disorder, recurrent, moderate: Secondary | ICD-10-CM

## 2022-12-31 DIAGNOSIS — F419 Anxiety disorder, unspecified: Secondary | ICD-10-CM

## 2022-12-31 NOTE — Telephone Encounter (Signed)
Requested medication (s) are due for refill today: Due 01/03/23  Requested medication (s) are on the active medication list: yes    Last refill: 12/03/22  #30  0 refills  Future visit scheduled yes  Notes to clinic:Pt started on med 12/03/22... F/U appt 01/03/22.    Pt has meds until 6/23. Review at appt?  New med for pt.  Requested Prescriptions  Pending Prescriptions Disp Refills   buPROPion (WELLBUTRIN XL) 150 MG 24 hr tablet [Pharmacy Med Name: BUPROPION XL 150MG  TABLETS (24 H)] 30 tablet 0    Sig: TAKE 1 TABLET(150 MG) BY MOUTH DAILY     Psychiatry: Antidepressants - bupropion Passed - 12/31/2022  3:19 AM      Passed - Cr in normal range and within 360 days    Creat  Date Value Ref Range Status  12/03/2022 0.69 0.50 - 0.97 mg/dL Final         Passed - AST in normal range and within 360 days    AST  Date Value Ref Range Status  12/03/2022 19 10 - 30 U/L Final         Passed - ALT in normal range and within 360 days    ALT  Date Value Ref Range Status  12/03/2022 16 6 - 29 U/L Final         Passed - Completed PHQ-2 or PHQ-9 in the last 360 days      Passed - Last BP in normal range    BP Readings from Last 1 Encounters:  12/03/22 110/72         Passed - Valid encounter within last 6 months    Recent Outpatient Visits           4 weeks ago Moderate episode of recurrent major depressive disorder [facility])   [facility] Berniece Salines, FNP       Future Appointments             In 4 days Zane Herald, Rudolpho Sevin, FNP [facility], [facility]

## 2022-12-31 NOTE — Telephone Encounter (Signed)
Has appt next week

## 2023-01-04 ENCOUNTER — Encounter: Payer: Self-pay | Admitting: Nurse Practitioner

## 2023-01-04 ENCOUNTER — Ambulatory Visit (INDEPENDENT_AMBULATORY_CARE_PROVIDER_SITE_OTHER): Payer: Managed Care, Other (non HMO) | Admitting: Nurse Practitioner

## 2023-01-04 ENCOUNTER — Other Ambulatory Visit: Payer: Self-pay

## 2023-01-04 VITALS — BP 120/70 | HR 94 | Temp 98.4°F | Resp 18 | Ht 62.0 in | Wt 206.9 lb

## 2023-01-04 DIAGNOSIS — F419 Anxiety disorder, unspecified: Secondary | ICD-10-CM | POA: Diagnosis not present

## 2023-01-04 DIAGNOSIS — F331 Major depressive disorder, recurrent, moderate: Secondary | ICD-10-CM | POA: Diagnosis not present

## 2023-01-04 MED ORDER — SERTRALINE HCL 25 MG PO TABS
25.0000 mg | ORAL_TABLET | Freq: Every day | ORAL | 0 refills | Status: DC
Start: 2023-01-04 — End: 2023-03-05

## 2023-01-04 NOTE — Progress Notes (Signed)
BP 120/70   Pulse 94   Temp 98.4 F (36.9 C) (Oral)   Resp 18   Ht 5\' 2"  (1.575 m)   Wt 206 lb 14.4 oz (93.8 kg)   SpO2 97%   BMI 37.84 kg/m    Subjective:    Patient ID: Yolanda Evans, female    DOB: 1986/01/03, 37 y.o.   MRN: [id number]  HPI: Yolanda Evans is a 37 y.o. female  Chief Complaint  Patient presents with   Depression   Anxiety    Follow up new medication    Depression/anxiety: patient here for 4 week follow up after restarting wellbutrin, she says that it has not really helped her at all like last time.  She was previously on a different medication however we are unsure what it was but it did cause weight gain.  Discussed other options, will trial adding zoloft 25 mg daily.   Medication :  wellbutrin 150 mg daily Compliant yes Side effects no side effects, she says that she felt like it did not really help PHQ9 positive GAD positive Therapy not now     01/04/2023    3:25 PM 12/03/2022    3:00 PM 01/03/2020    3:37 PM 04/05/2019    4:40 PM 01/25/2019    4:03 PM  Depression screen PHQ 2/9  Decreased Interest 3 2 0 1 3  Down, Depressed, Hopeless 2 1 0 0 1  PHQ - 2 Score 5 3 0 1 4  Altered sleeping 3 2 2  3   Tired, decreased energy 3 3 2  3   Change in appetite 0 0 0  0  Feeling bad or failure about yourself  0 0 0  0  Trouble concentrating 1 2 0  3  Moving slowly or fidgety/restless 0 0 0  0  Suicidal thoughts 0 0 0  0  PHQ-9 Score 12 10 4  13   Difficult doing work/chores Somewhat difficult Somewhat difficult Somewhat difficult  Somewhat difficult       01/04/2023    3:26 PM 12/03/2022    3:00 PM 04/05/2019    4:42 PM 01/25/2019    4:04 PM  GAD 7 : Generalized Anxiety Score  Nervous, Anxious, on Edge 1 2 1 3   Control/stop worrying 0 1 0 0  Worry too much - different things 0 2 0 0  Trouble relaxing 1 2 0 3  Restless 0 2 0 1  Easily annoyed or irritable 1 2 1 3   Afraid - awful might happen 0 0 0 0  Total GAD 7 Score 3 11 2 10   Anxiety Difficulty Not  difficult at all Not difficult at all Not difficult at all Somewhat difficult     Relevant past medical, surgical, family and social history reviewed and updated as indicated. Interim medical history since our last visit reviewed. Allergies and medications reviewed and updated.  Review of Systems  Constitutional: Negative for fever or weight change.  Respiratory: Negative for cough and shortness of breath.   Cardiovascular: Negative for chest pain or palpitations.  Gastrointestinal: Negative for abdominal pain, no bowel changes.  Musculoskeletal: Negative for gait problem or joint swelling.  Skin: Negative for rash.  Neurological: Negative for dizziness or headache.  No other specific complaints in a complete review of systems (except as listed in HPI above).      Objective:    BP 120/70   Pulse 94   Temp 98.4 F (36.9 C) (Oral)   Resp 18  Ht 5\' 2"  (1.575 m)   Wt 206 lb 14.4 oz (93.8 kg)   SpO2 97%   BMI 37.84 kg/m   Wt Readings from Last 3 Encounters:  01/04/23 206 lb 14.4 oz (93.8 kg)  12/03/22 208 lb 3.2 oz (94.4 kg)  12/23/20 195 lb (88.5 kg)    Physical Exam  Constitutional: Patient appears well-developed and well-nourished. Obese  No distress.  HEENT: head atraumatic, normocephalic, pupils equal and reactive to light, neck supple, throat within normal limits Cardiovascular: Normal rate, regular rhythm and normal heart sounds.  No murmur heard. No BLE edema. Pulmonary/Chest: Effort normal and breath sounds normal. No respiratory distress. Abdominal: Soft.  There is no tenderness. Psychiatric: Patient has a normal mood and affect. behavior is normal. Judgment and thought content normal.      Assessment & Plan:   Problem List Items Addressed This Visit       Other   Depression - Primary    Continue wellbutrin 150 mg daily, add on zoloft 25 mg daily      Relevant Medications   sertraline (ZOLOFT) 25 MG tablet   Anxiety    Continue wellbutrin 150 mg daily,  add on zoloft 25 mg daily      Relevant Medications   sertraline (ZOLOFT) 25 MG tablet     Follow up plan: Return in about 4 weeks (around 02/01/2023) for follow up.

## 2023-01-04 NOTE — Assessment & Plan Note (Signed)
Continue wellbutrin 150 mg daily, add on zoloft 25 mg daily

## 2023-01-04 NOTE — Assessment & Plan Note (Signed)
Continue wellbutrin 150 mg daily, add on zoloft 25 mg daily 

## 2023-01-18 ENCOUNTER — Encounter: Payer: Self-pay | Admitting: Nurse Practitioner

## 2023-01-19 ENCOUNTER — Other Ambulatory Visit: Payer: Self-pay | Admitting: Nurse Practitioner

## 2023-01-19 DIAGNOSIS — F331 Major depressive disorder, recurrent, moderate: Secondary | ICD-10-CM

## 2023-01-19 MED ORDER — BUPROPION HCL ER (XL) 300 MG PO TB24
300.0000 mg | ORAL_TABLET | Freq: Every day | ORAL | 0 refills | Status: DC
Start: 2023-01-19 — End: 2023-02-13

## 2023-02-03 ENCOUNTER — Ambulatory Visit: Payer: Managed Care, Other (non HMO) | Admitting: Nurse Practitioner

## 2023-02-13 ENCOUNTER — Other Ambulatory Visit: Payer: Self-pay | Admitting: Nurse Practitioner

## 2023-02-13 DIAGNOSIS — F331 Major depressive disorder, recurrent, moderate: Secondary | ICD-10-CM

## 2023-02-15 MED ORDER — BUPROPION HCL ER (XL) 300 MG PO TB24
300.0000 mg | ORAL_TABLET | Freq: Every day | ORAL | 0 refills | Status: DC
Start: 2023-02-15 — End: 2023-03-05

## 2023-02-16 ENCOUNTER — Other Ambulatory Visit: Payer: Self-pay | Admitting: Nurse Practitioner

## 2023-02-16 DIAGNOSIS — F331 Major depressive disorder, recurrent, moderate: Secondary | ICD-10-CM

## 2023-02-16 NOTE — Telephone Encounter (Signed)
Rx filled 02/15/23 by provider. Requested Prescriptions  Pending Prescriptions Disp Refills   buPROPion (WELLBUTRIN XL) 300 MG 24 hr tablet [Pharmacy Med Name: BUPROPION XL 300MG  TABLETS] 30 tablet 0    Sig: TAKE 1 TABLET(300 MG) BY MOUTH DAILY     Psychiatry: Antidepressants - bupropion Passed - 02/16/2023  3:22 AM      Passed - Cr in normal range and within 360 days    Creat  Date Value Ref Range Status  12/03/2022 0.69 0.50 - 0.97 mg/dL Final         Passed - AST in normal range and within 360 days    AST  Date Value Ref Range Status  12/03/2022 19 10 - 30 U/L Final         Passed - ALT in normal range and within 360 days    ALT  Date Value Ref Range Status  12/03/2022 16 6 - 29 U/L Final         Passed - Completed PHQ-2 or PHQ-9 in the last 360 days      Passed - Last BP in normal range    BP Readings from Last 1 Encounters:  01/04/23 120/70         Passed - Valid encounter within last 6 months    Recent Outpatient Visits           1 month ago Moderate episode of recurrent major depressive disorder [facility])   [place] [facility] Della Goo F, FNP   2 months ago Moderate episode of recurrent major depressive disorder [facility])   [facility] Berniece Salines, FNP       Future Appointments             In 2 weeks Zane Herald, Rudolpho Sevin, FNP [facility], [facility]

## 2023-03-05 ENCOUNTER — Ambulatory Visit (INDEPENDENT_AMBULATORY_CARE_PROVIDER_SITE_OTHER): Payer: Managed Care, Other (non HMO) | Admitting: Nurse Practitioner

## 2023-03-05 ENCOUNTER — Other Ambulatory Visit: Payer: Self-pay | Admitting: Nurse Practitioner

## 2023-03-05 ENCOUNTER — Encounter: Payer: Self-pay | Admitting: Nurse Practitioner

## 2023-03-05 ENCOUNTER — Other Ambulatory Visit: Payer: Self-pay

## 2023-03-05 VITALS — BP 118/72 | HR 94 | Temp 98.1°F | Resp 18 | Ht 62.0 in | Wt 207.8 lb

## 2023-03-05 DIAGNOSIS — F419 Anxiety disorder, unspecified: Secondary | ICD-10-CM

## 2023-03-05 DIAGNOSIS — W540XXA Bitten by dog, initial encounter: Secondary | ICD-10-CM | POA: Diagnosis not present

## 2023-03-05 DIAGNOSIS — F331 Major depressive disorder, recurrent, moderate: Secondary | ICD-10-CM

## 2023-03-05 DIAGNOSIS — B379 Candidiasis, unspecified: Secondary | ICD-10-CM

## 2023-03-05 MED ORDER — AMOXICILLIN-POT CLAVULANATE 875-125 MG PO TABS
1.0000 | ORAL_TABLET | Freq: Two times a day (BID) | ORAL | 0 refills | Status: DC
Start: 2023-03-05 — End: 2023-06-15

## 2023-03-05 MED ORDER — BUPROPION HCL ER (XL) 300 MG PO TB24
300.0000 mg | ORAL_TABLET | Freq: Every day | ORAL | 1 refills | Status: DC
Start: 2023-03-05 — End: 2023-06-15

## 2023-03-05 MED ORDER — FLUCONAZOLE 150 MG PO TABS
150.0000 mg | ORAL_TABLET | ORAL | 0 refills | Status: DC | PRN
Start: 2023-03-05 — End: 2023-05-05

## 2023-03-05 NOTE — Progress Notes (Signed)
BP 118/72   Pulse 94   Temp 98.1 F (36.7 C) (Oral)   Resp 18   Ht 5\' 2"  (1.575 m)   Wt 207 lb 12.8 oz (94.3 kg)   LMP 02/25/2023   SpO2 98%   BMI 38.01 kg/m    Subjective:    Patient ID: Yolanda Evans, female    DOB: [birth year], 37 y.o.   MRN: [id number]  HPI: Yolanda Evans is a 37 y.o. female  Chief Complaint  Patient presents with   Depression   Anxiety   Depression/anxiety Medication  wellbutrin 300 mg daily Compliant yes Side effects no PHQ9 improved GAD improved Therapy not currently Tried zoloft but did not like how she felt on it.     03/05/2023    3:47 PM 01/04/2023    3:25 PM 12/03/2022    3:00 PM 01/03/2020    3:37 PM 04/05/2019    4:40 PM  Depression screen PHQ 2/9  Decreased Interest 0 3 2 0 1  Down, Depressed, Hopeless 0 2 1 0 0  PHQ - 2 Score 0 5 3 0 1  Altered sleeping 1 3 2 2    Tired, decreased energy 0 3 3 2    Change in appetite 0 0 0 0   Feeling bad or failure about yourself  0 0 0 0   Trouble concentrating 1 1 2  0   Moving slowly or fidgety/restless 0 0 0 0   Suicidal thoughts 0 0 0 0   PHQ-9 Score 2 12 10 4    Difficult doing work/chores Not difficult at all Somewhat difficult Somewhat difficult Somewhat difficult        03/05/2023    3:47 PM 01/04/2023    3:26 PM 12/03/2022    3:00 PM 04/05/2019    4:42 PM  GAD 7 : Generalized Anxiety Score  Nervous, Anxious, on Edge 0 1 2 1   Control/stop worrying 0 0 1 0  Worry too much - different things 0 0 2 0  Trouble relaxing 0 1 2 0  Restless 0 0 2 0  Easily annoyed or irritable 1 1 2 1   Afraid - awful might happen 0 0 0 0  Total GAD 7 Score 1 3 11 2   Anxiety Difficulty Not difficult at all Not difficult at all Not difficult at all Not difficult at all    Dog bite right ring finger: she was bit by her dog about a month ago.  She has been keeping in clean and reports it looks better than it did.  But still swollen and pink.  Going to treat with Augmentin,  if no improvement may need to see ortho.  But currently has full range of motion.   Relevant past medical, surgical, family and social history reviewed and updated as indicated. Interim medical history since our last visit reviewed. Allergies and medications reviewed and updated.  Review of Systems  Constitutional: Negative for fever or weight change.  Respiratory: Negative for cough and shortness of breath.   Cardiovascular: Negative for chest pain or palpitations.  Gastrointestinal: Negative for abdominal pain, no bowel changes.  Musculoskeletal: Negative for gait problem or joint swelling. Right ring finger pink and swollen Skin: Negative for rash.  Neurological: Negative for dizziness or headache.  No other specific complaints in a complete review of systems (except as listed in HPI above).      Objective:    BP 118/72   Pulse 94   Temp 98.1 F (36.7 C) (Oral)  Resp 18   Ht 5\' 2"  (1.575 m)   Wt 207 lb 12.8 oz (94.3 kg)   LMP 02/25/2023   SpO2 98%   BMI 38.01 kg/m   Wt Readings from Last 3 Encounters:  03/05/23 207 lb 12.8 oz (94.3 kg)  01/04/23 206 lb 14.4 oz (93.8 kg)  12/03/22 208 lb 3.2 oz (94.4 kg)    Physical Exam  Constitutional: Patient appears well-developed and well-nourished. Obese  No distress.  HEENT: head atraumatic, normocephalic, pupils equal and reactive to light, neck supple, throat within normal limits Cardiovascular: Normal rate, regular rhythm and normal heart sounds.  No murmur heard. No BLE edema. Pulmonary/Chest: Effort normal and breath sounds normal. No respiratory distress. Abdominal: Soft.  There is no tenderness. Msk: Right ring finger pink and swollen Psychiatric: Patient has a normal mood and affect. behavior is normal. Judgment and thought content normal.      Assessment & Plan:   Problem List Items Addressed This Visit       Other   Depression - Primary    Reports doing well on wellbutrin 300 mg daily will continue with current treatment.       Relevant Medications    buPROPion (WELLBUTRIN XL) 300 MG 24 hr tablet   Anxiety    Reports doing well on wellbutrin 300 mg daily will continue with current treatment.       Relevant Medications   buPROPion (WELLBUTRIN XL) 300 MG 24 hr tablet   Other Visit Diagnoses     Dog bite, initial encounter       keep area clean,  take augmentin, reach out if no improvement   Relevant Medications   amoxicillin-clavulanate (AUGMENTIN) 875-125 MG tablet         Follow up plan: Return in about 6 months (around 09/05/2023) for follow up.

## 2023-03-05 NOTE — Assessment & Plan Note (Signed)
Reports doing well on wellbutrin 300 mg daily will continue with current treatment.

## 2023-05-05 ENCOUNTER — Other Ambulatory Visit: Payer: Self-pay | Admitting: Nurse Practitioner

## 2023-05-05 DIAGNOSIS — B379 Candidiasis, unspecified: Secondary | ICD-10-CM

## 2023-05-06 MED ORDER — FLUCONAZOLE 150 MG PO TABS: 150.0000 mg | ORAL_TABLET | ORAL | 0 refills | Status: DC | PRN

## 2023-05-21 ENCOUNTER — Other Ambulatory Visit: Payer: Self-pay | Admitting: Medical Genetics

## 2023-05-21 DIAGNOSIS — Z006 Encounter for examination for normal comparison and control in clinical research program: Secondary | ICD-10-CM

## 2023-05-29 ENCOUNTER — Other Ambulatory Visit: Payer: BC Managed Care – PPO

## 2023-06-15 ENCOUNTER — Encounter: Payer: Self-pay | Admitting: Advanced Practice Midwife

## 2023-06-15 ENCOUNTER — Ambulatory Visit (INDEPENDENT_AMBULATORY_CARE_PROVIDER_SITE_OTHER): Payer: BC Managed Care – PPO | Admitting: Advanced Practice Midwife

## 2023-06-15 VITALS — BP 108/80 | HR 97 | Ht 62.0 in | Wt 202.9 lb

## 2023-06-15 DIAGNOSIS — O3680X0 Pregnancy with inconclusive fetal viability, not applicable or unspecified: Secondary | ICD-10-CM

## 2023-06-15 DIAGNOSIS — Z3201 Encounter for pregnancy test, result positive: Secondary | ICD-10-CM

## 2023-06-15 DIAGNOSIS — N912 Amenorrhea, unspecified: Secondary | ICD-10-CM

## 2023-06-15 LAB — POCT URINE PREGNANCY: Preg Test, Ur: POSITIVE — AB

## 2023-06-15 NOTE — Progress Notes (Signed)
Patient ID: Yolanda Evans, female   DOB: Aug 09, 1985, 37 y.o.   MRN: [id number]  Reason for Visit: pregnancy confirmation   Subjective:  HPI:  Yolanda Evans is a 37 y.o. female being seen for a pregnancy confirmation visit. Her LMP was 04/30/23. She had a positive home test on 06/02/23. She has a history of IAB in 2008 and 2 SABs in 2016. This is a desired pregnancy. She and her partner have been trying for 3 years to be pregnant. She had some pink/red with wiping in the last few days and brown spotting today and is worried the pregnancy may not be viable. She has had some nausea which has improved some since taking vitamin B6. Reassurance given. We discussed doing serial Beta levels now and viability scan in a week given her history of miscarriages.   Blood type is A+ per [facility] records.  Past Medical History:  Diagnosis Date   Anxiety    Depression    GERD (gastroesophageal reflux disease)    No known health problems    Family History  Problem Relation Age of Onset   Depression Father    Cancer Maternal Grandmother    Breast cancer Paternal Grandmother        no contact   Past Surgical History:  Procedure Laterality Date   NO PAST SURGERIES      Short Social History:  Social History   Tobacco Use   Smoking status: Former    Current packs/day: 0.25    Types: Cigarettes   Smokeless tobacco: Never  Substance Use Topics   Alcohol use: Yes    Comment: social    No Known Allergies  No current outpatient medications on file.   No current facility-administered medications for this visit.    Review of Systems  Constitutional:  Negative for chills and fever.  HENT:  Negative for congestion, ear discharge, ear pain, hearing loss, sinus pain and sore throat.   Eyes:  Negative for blurred vision and double vision.  Respiratory:  Negative for cough, shortness of breath and wheezing.   Cardiovascular:  Negative for chest pain, palpitations and leg swelling.   Gastrointestinal:  Positive for nausea. Negative for abdominal pain, blood in stool, constipation, diarrhea, heartburn, melena and vomiting.  Genitourinary:  Negative for dysuria, flank pain, frequency, hematuria and urgency.  Musculoskeletal:  Negative for back pain, joint pain and myalgias.  Skin:  Negative for itching and rash.  Neurological:  Negative for dizziness, tingling, tremors, sensory change, speech change, focal weakness, seizures, loss of consciousness, weakness and headaches.  Endo/Heme/Allergies:  Negative for environmental allergies. Does not bruise/bleed easily.  Psychiatric/Behavioral:  Negative for depression, hallucinations, memory loss, substance abuse and suicidal ideas. The patient is not nervous/anxious and does not have insomnia.        Objective:  Objective   Vitals:   06/15/23 0805  BP: 108/80  Pulse: 97  Weight: 202 lb 14.4 oz (92 kg)  Height: 5\' 2"  (1.575 m)   Body mass index is 37.11 kg/m. Constitutional: Well nourished, well developed female in no acute distress.  HEENT: normal Skin: Warm and dry.   Extremity:  no edema   Respiratory:  Normal respiratory effort Psych: Alert and Oriented x3. No memory deficits. Normal mood and affect.    Data:  Latest Reference Range & Units 06/15/23 08:16  Preg Test, Ur Negative  Positive !  !: Data is abnormal     Assessment/Plan:     37 y.o. G4  P71 female with positive urine pregnancy test  Serial Beta Hcg levels; today and Thursday Viability ultrasound in 1 week NOB visits/labs   Tresea Mall, CNM [facility] 06/15/2023 5:28 PM

## 2023-06-16 LAB — BETA HCG QUANT (REF LAB): hCG Quant: 543 m[IU]/mL

## 2023-06-17 ENCOUNTER — Ambulatory Visit: Payer: BC Managed Care – PPO

## 2023-06-17 ENCOUNTER — Other Ambulatory Visit: Payer: BC Managed Care – PPO

## 2023-06-17 DIAGNOSIS — O3680X0 Pregnancy with inconclusive fetal viability, not applicable or unspecified: Secondary | ICD-10-CM

## 2023-06-18 LAB — BETA HCG QUANT (REF LAB): hCG Quant: 310 m[IU]/mL

## 2023-06-24 ENCOUNTER — Telehealth: Payer: BC Managed Care – PPO

## 2023-06-24 VITALS — Wt 202.0 lb

## 2023-06-24 DIAGNOSIS — Z3480 Encounter for supervision of other normal pregnancy, unspecified trimester: Secondary | ICD-10-CM | POA: Insufficient documentation

## 2023-06-24 DIAGNOSIS — O3680X0 Pregnancy with inconclusive fetal viability, not applicable or unspecified: Secondary | ICD-10-CM

## 2023-06-24 DIAGNOSIS — Z3A01 Less than 8 weeks gestation of pregnancy: Secondary | ICD-10-CM

## 2023-06-24 NOTE — Addendum Note (Signed)
Addended by: Kathlene Cote on: 06/24/2023 10:27 AM   Modules accepted: Orders

## 2023-06-24 NOTE — Progress Notes (Signed)
NOB Intake was started; we could see each other but I could not hear her so I called her and kept the video up.  When we came to the dating section she stated she didn't think she was preg anymore; she passed tissue Saturday. Today she is at work; she is spotting and has no cramping.  Pt asked if there would be any f/u; adv we needed to follow her Beta HCGs until they were zero; orders entered; pt aware to watch for phone call to schedule lab appts. Pt surprised no one contacted her about her last level being lower than the one before; I apologized for that. Pt aware to let us know if she has any problems.

## 2023-06-25 ENCOUNTER — Other Ambulatory Visit: Payer: BC Managed Care – PPO

## 2023-06-25 DIAGNOSIS — O3680X0 Pregnancy with inconclusive fetal viability, not applicable or unspecified: Secondary | ICD-10-CM

## 2023-06-26 LAB — BETA HCG QUANT (REF LAB): hCG Quant: 5 m[IU]/mL

## 2023-06-28 ENCOUNTER — Other Ambulatory Visit: Payer: BC Managed Care – PPO

## 2023-08-13 ENCOUNTER — Other Ambulatory Visit: Payer: Self-pay | Admitting: Otolaryngology

## 2023-08-13 DIAGNOSIS — E041 Nontoxic single thyroid nodule: Secondary | ICD-10-CM

## 2023-08-24 ENCOUNTER — Ambulatory Visit
Admission: RE | Admit: 2023-08-24 | Discharge: 2023-08-24 | Disposition: A | Discharge status: Home / Self Care | Payer: BC Managed Care – PPO | Source: Ambulatory Visit | Attending: Otolaryngology | Admitting: Otolaryngology

## 2023-08-24 DIAGNOSIS — E041 Nontoxic single thyroid nodule: Secondary | ICD-10-CM

## 2023-09-06 ENCOUNTER — Ambulatory Visit: Payer: Self-pay | Admitting: Nurse Practitioner

## 2024-03-20 ENCOUNTER — Other Ambulatory Visit (HOSPITAL_COMMUNITY)
Admission: RE | Admit: 2024-03-20 | Discharge: 2024-03-20 | Disposition: A | Discharge status: Home / Self Care | Source: Ambulatory Visit | Attending: Advanced Practice Midwife | Admitting: Advanced Practice Midwife

## 2024-03-20 ENCOUNTER — Ambulatory Visit (INDEPENDENT_AMBULATORY_CARE_PROVIDER_SITE_OTHER): Admitting: Advanced Practice Midwife

## 2024-03-20 ENCOUNTER — Encounter: Payer: Self-pay | Admitting: Advanced Practice Midwife

## 2024-03-20 VITALS — BP 119/80 | HR 83 | Ht 62.0 in | Wt 205.9 lb

## 2024-03-20 DIAGNOSIS — R102 Pelvic and perineal pain: Secondary | ICD-10-CM

## 2024-03-20 DIAGNOSIS — Z01419 Encounter for gynecological examination (general) (routine) without abnormal findings: Secondary | ICD-10-CM | POA: Insufficient documentation

## 2024-03-20 DIAGNOSIS — Z124 Encounter for screening for malignant neoplasm of cervix: Secondary | ICD-10-CM

## 2024-03-20 NOTE — Patient Instructions (Signed)
 Natural Family Planning: What to Know Natural family planning (NFP) is a type of birth control that doesn't use medicine or devices to prevent pregnancy. NFP can decrease the risk of pregnancy, although other birth control methods are more effective at preventing pregnancy. NFP doesn't protect against sexually transmitted infections (STIs). With NFP, you'll need to track the days each month when you ovulate. Ovulation is when your ovary releases an egg. Ovulation is the time of the menstrual cycle when you're most likely to get pregnant. Not having sex during ovulation lowers the chance of pregnancy. How does NFP work? You can use NFP to track days between periods and guess (predict) the release of an egg. You can use cycle patterns to decide when to have sex or not have sex. Many people have a period every 28-30 days, but cycles vary. Cycles can range between 23-35 days. A short or long cycle will affect the period of time that you can get pregnant. Ovulation happens 12-14 days before the start of the next period. The days you're most likely to get pregnant (fertile days) will vary based on how many days are between periods. A person with a 28-day menstrual cycle has about 6 days a month when they're the most fertile. An egg is fertile for 24 hours after it leaves the ovary. Sperm can live for 3 days or more. If you decide that you want to get pregnant, NFP can help you know when pregnancy is most likely to happen. What NFP methods can be used to prevent pregnancy? The basal body temperature method  During ovulation, a slight increase in body temperature is common. To use this method: Take and record your temperature every morning before getting out of bed. You may record temperatures anywhere you can best access and view patterns of change. You may track: On a piece of paper. On your calendar. On a chart. In an app on your phone. Do not have sex from the day the menstrual period starts until 3  days after the increase in temperature. If your temperature has already gotten higher, ovulation has already happened. You're more likely to get pregnant 2 to 3 days before the charted cycles of body temperature increase. Things that raise your body temperature and make it hard to guess when you'll ovulate: Having a fever. Not sleeping well. Not drinking enough fluid. Certain work schedules.  The cervical mucus method Right before ovulation, mucus from the cervix changes from dry and sticky to wet and slippery. The cervix is the lowest part of the uterus. To use this method: Check the mucus every day to look for these changes. Ovulation happens on the last day of wet, slippery mucus. Do not have sex starting when you first see wet, slippery mucus until 4 days after it stops or returns to normal. With this method, it's safe to have sex: After the 4 days have passed, and until 10 days after the menstrual period starts. On days when mucus is dry. Cervical mucus can increase or change due to reasons other than ovulation, such as infection, lubricants, some medicines, and being aroused. The symptothermal method This method combines the basal body temperature and the cervical mucus method and is more accurate. The calendar method This method involves tracking menstrual cycles to find out when ovulation happens. This method is helpful when cycle length varies. To use this method: For 6 months, write down when periods start and end and the length of each menstrual cycle. The cycle length  is from day 1 of the current menstrual period to day 1 of the next period. Use this information to find when you'll likely ovulate. Avoid sex during that time. You may need help from your health care provider to guess the days you're most likely to get pregnant. Ovulation usually happens 12-14 days before the start of the next menstrual period. Light vaginal bleeding, called spotting, or belly cramps during the  middle of a menstrual cycle may be signs of ovulation. However, some people don't have these symptoms.  The standard days method This method is based on studies of hormone levels during normal menstrual cycles. This method works best for people with cycles that are 26-32 days long. If your cycle is 26-32 days long, you're most likely to get pregnant between days 8 and 19. To prevent pregnancy, you should not have sex during this time, or you should use a barrier method of birth control. When should I not use the NFP method? You should not use NFP if: You have irregular menstrual periods, such as: You sometimes skip a period. You have bleeding from your vagina that isn't normal. You recently had a baby or are breastfeeding. You have an infection of your vagina or cervix. You take medicines that can affect the mucus from your vagina or body temperature, such as: Antibiotics. Thyroid medicines. Antihistamines that are in some cold and allergy medicines. You do not want to become pregnant at this time. Other methods of birth control are more effective at preventing pregnancy. You're worried about STIs. This information is not intended to replace advice given to you by your health care provider. Make sure you discuss any questions you have with your health care provider. Document Revised: 03/12/2023 Document Reviewed: 03/12/2023 Elsevier Patient Education  2024 [employer].

## 2024-03-20 NOTE — Progress Notes (Unsigned)
   Patient ID: Yolanda Evans, female   DOB: March 06, 1986, 38 y.o.   MRN: [id number]  Reason for Consult: Abdominal Pain   Referred by Gareth Mliss FALCON, FNP  Subjective:     HPI:  Yolanda Evans is a 38 y.o. female with concerns of LLQ pain that has been intermittent since July, patient reports that pain is usually when ovulating, patient denies bleeding but reports nausea and vomiting. Patient describes sharp/stabbing pain. Patient states that she has had relief from pain by taking otc Tylenol or applying heat. Patient reports that she did have a miscarriage in November 2024.   Last pap-12/25/2018 ( Negative)  Past Medical History:  Diagnosis Date   Anxiety    Depression    GERD (gastroesophageal reflux disease)    No known health problems    Family History  Problem Relation Age of Onset   Depression Father    Cancer Maternal Grandmother    Breast cancer Paternal Grandmother        no contact   Past Surgical History:  Procedure Laterality Date   NO PAST SURGERIES      Short Social History:  Social History   Tobacco Use   Smoking status: Former    Current packs/day: 0.25    Types: Cigarettes   Smokeless tobacco: Never  Substance Use Topics   Alcohol use: Yes    Comment: social    No Known Allergies  Current Outpatient Medications  Medication Sig Dispense Refill   Prenatal Vit-Fe Fumarate-FA (MULTIVITAMIN-PRENATAL) 27-0.8 MG TABS tablet Take 1 tablet by mouth daily at 12 noon.     No current facility-administered medications for this visit.    REVIEW OF SYSTEMS      Objective:  Objective   Vitals:   03/20/24 1322  BP: 119/80  Pulse: 83  Weight: 205 lb 14.4 oz (93.4 kg)  Height: 5' 2 (1.575 m)   Body mass index is 37.66 kg/m.  Physical Exam  Data: ***     Assessment/Plan:     ***     Slater Rains CNM

## 2024-03-21 ENCOUNTER — Encounter: Payer: Self-pay | Admitting: Advanced Practice Midwife

## 2024-03-23 LAB — CYTOLOGY - PAP
Comment: NEGATIVE
Diagnosis: NEGATIVE
High risk HPV: NEGATIVE

## 2024-03-26 ENCOUNTER — Ambulatory Visit: Payer: Self-pay | Admitting: Advanced Practice Midwife

## 2024-04-05 ENCOUNTER — Ambulatory Visit (INDEPENDENT_AMBULATORY_CARE_PROVIDER_SITE_OTHER)

## 2024-04-05 DIAGNOSIS — R102 Pelvic and perineal pain: Secondary | ICD-10-CM | POA: Diagnosis not present

## 2024-04-17 ENCOUNTER — Encounter: Payer: Self-pay | Admitting: Advanced Practice Midwife

## 2024-04-23 ENCOUNTER — Other Ambulatory Visit: Payer: Self-pay | Admitting: Medical Genetics

## 2024-04-23 DIAGNOSIS — Z006 Encounter for examination for normal comparison and control in clinical research program: Secondary | ICD-10-CM

## 2024-05-03 ENCOUNTER — Ambulatory Visit: Admitting: Obstetrics & Gynecology

## 2024-05-03 ENCOUNTER — Encounter: Payer: Self-pay | Admitting: Obstetrics & Gynecology

## 2024-05-03 VITALS — BP 120/72 | HR 91 | Ht 62.0 in | Wt 209.0 lb

## 2024-05-03 DIAGNOSIS — N83201 Unspecified ovarian cyst, right side: Secondary | ICD-10-CM | POA: Diagnosis not present

## 2024-05-03 DIAGNOSIS — Z3169 Encounter for other general counseling and advice on procreation: Secondary | ICD-10-CM

## 2024-05-03 DIAGNOSIS — N7093 Salpingitis and oophoritis, unspecified: Secondary | ICD-10-CM

## 2024-05-03 DIAGNOSIS — R102 Pelvic and perineal pain unspecified side: Secondary | ICD-10-CM

## 2024-05-03 NOTE — Progress Notes (Signed)
    GYNECOLOGY PROGRESS NOTE  Subjective:    Patient ID: Yolanda Evans, female    DOB: January 10, 1986, 38 y.o.   MRN: [id number]  HPI  Patient is a 38 y.o. monogamous G4P0040 here today to discuss her recent ultrasound results. She saw Slater Rains 03/20/2024 with pelvic pain. This pain has been present for years, worse in July. The pain lasted for about an hour or so but is no longer present.  She has a h/o + CT/GC at least 5 years ago but was negative recently.   Of note she has been trying to get pregnant for years and has not had a successful pregnancy.   The following portions of the patient's history were reviewed and updated as appropriate: allergies, current medications, past family history, past medical history, past social history, past surgical history, and problem list.  Review of Systems Pertinent items are noted in HPI.  Pap smear was 03/2024 and normal.  She has a h/o + CT/GC at least 5 years ago but was negative recently.   Objective:   Blood pressure 120/72, pulse 91, height 5' 2 (1.575 m), weight 209 lb (94.8 kg), last menstrual period 04/21/2024. Body mass index is 38.23 kg/m.  EG- normal Bimanual reveals a 12 week size uterus (not consistent with her ultrasound last month) No pain with deep palpation  Assessment:   1. Pelvic pain   2. Right ovarian cyst   3.      Desire for pregnancy, fertility issues  Plan:   1. Pelvic pain (Primary) - not currrently, mostly with ovulation  2. Right ovarian cyst versus TOA - rec MRI  3. Referral to RE made

## 2024-05-08 NOTE — Addendum Note (Signed)
 Addended by: DONELDA BURNARD CROME on: 05/08/2024 03:02 PM   Modules accepted: Orders

## 2024-05-10 ENCOUNTER — Other Ambulatory Visit: Payer: Self-pay | Admitting: Obstetrics & Gynecology

## 2024-05-10 ENCOUNTER — Telehealth: Payer: Self-pay

## 2024-05-12 NOTE — Addendum Note (Signed)
 Addended by: TAFT CAMELIA MATSU on: 05/12/2024 02:22 PM   Modules accepted: Orders

## 2024-05-25 ENCOUNTER — Ambulatory Visit
Admission: RE | Admit: 2024-05-25 | Discharge: 2024-05-25 | Disposition: A | Source: Ambulatory Visit | Attending: Obstetrics & Gynecology | Admitting: Obstetrics & Gynecology

## 2024-05-25 DIAGNOSIS — N7093 Salpingitis and oophoritis, unspecified: Secondary | ICD-10-CM

## 2024-05-25 MED ORDER — GADOPICLENOL 0.5 MMOL/ML IV SOLN
10.0000 mL | Freq: Once | INTRAVENOUS | Status: AC | PRN
  Administered 2024-05-25: 10 mL via INTRAVENOUS

## 2024-05-29 ENCOUNTER — Ambulatory Visit: Payer: Self-pay | Admitting: Obstetrics & Gynecology

## 2024-07-12 NOTE — Telephone Encounter (Signed)
"  error  "
